# Patient Record
Sex: Male | Born: 1975 | Hispanic: Yes | Marital: Single | Smoking: Current some day smoker
Health system: Southern US, Community
[De-identification: ages and names within clinical notes are randomized; demographics above are authoritative.]

## PROBLEM LIST (undated history)

## (undated) DIAGNOSIS — J439 Emphysema, unspecified: Secondary | ICD-10-CM

## (undated) HISTORY — PX: NO PAST SURGERIES: SHX2092

---

## 2008-07-24 ENCOUNTER — Emergency Department: Payer: Self-pay | Admitting: Emergency Medicine

## 2014-05-09 ENCOUNTER — Emergency Department: Payer: Self-pay | Admitting: Emergency Medicine

## 2015-09-29 ENCOUNTER — Emergency Department
Admission: EM | Admit: 2015-09-29 | Discharge: 2015-09-29 | Disposition: A | Discharge status: Home / Self Care | Payer: PRIVATE HEALTH INSURANCE | Attending: Emergency Medicine | Admitting: Emergency Medicine

## 2015-09-29 ENCOUNTER — Encounter: Payer: Self-pay | Admitting: Emergency Medicine

## 2015-09-29 DIAGNOSIS — J029 Acute pharyngitis, unspecified: Secondary | ICD-10-CM | POA: Diagnosis not present

## 2015-09-29 DIAGNOSIS — R509 Fever, unspecified: Secondary | ICD-10-CM | POA: Diagnosis present

## 2015-09-29 LAB — POCT RAPID STREP A: Streptococcus, Group A Screen (Direct): NEGATIVE

## 2015-09-29 MED ORDER — AZITHROMYCIN 250 MG PO TABS: ORAL_TABLET | ORAL | Status: DC

## 2015-09-29 NOTE — ED Provider Notes (Signed)
[facility] Emergency Department Provider Note ____________________________________________  Time seen: 0715  I have reviewed the triage vital signs and the nursing notes.  HISTORY  Chief Complaint  Fever and Generalized Body Aches  History limited by Spanish language. Interpreter (TeleInterpreter) present during interview & exam.   HPI Paul Hayes is a 40 y.o. male presents to the ED with complaints generalized body aches and pains. He also notes onset of fever last night. He took some Advil for his symptoms and presents today for further evaluation. He also notes a generalized headache since yesterday.  History reviewed. No pertinent past medical history.  There are no active problems to display for this patient.  History reviewed. No pertinent past surgical history.  Current Outpatient Rx  Name  Route  Sig  Dispense  Refill  . azithromycin (ZITHROMAX Z-PAK) 250 MG tablet      Take 2 tablets (500 mg) on  Day 1,  followed by 1 tablet (250 mg) once daily on Days 2 through 5.   6 each   0    Allergies Review of patient's allergies indicates no known allergies.  No family history on file.  Social History Social History  Substance Use Topics  . Smoking status: Never Smoker   . Smokeless tobacco: None  . Alcohol Use: No   Review of Systems  Constitutional: Positive for fever and bodyaches Eyes: Negative for visual changes. ENT: Positive for sore throat. Cardiovascular: Negative for chest pain. Respiratory: Negative for shortness of breath. Gastrointestinal: Negative for abdominal pain, vomiting and diarrhea. Genitourinary: Negative for dysuria. Musculoskeletal: Negative for back pain. Skin: Negative for rash. Neurological: Negative for headaches, focal weakness or numbness. ____________________________________________  PHYSICAL EXAM:  VITAL SIGNS: ED Triage Vitals  Enc Vitals Group     BP 09/29/15 0418 118/76 mmHg   Pulse Rate 09/29/15 0418 100     Resp 09/29/15 0418 18     Temp 09/29/15 0418 100.5 F (38.1 C)     Temp Source 09/29/15 0418 Oral     SpO2 09/29/15 0418 97 %     Weight 09/29/15 0418 148 lb (67.132 kg)     Height 09/29/15 0418 5\' 5"  (1.651 m)     Head Cir --      Peak Flow --      Pain Score 09/29/15 0420 10     Pain Loc --      Pain Edu? --      Excl. in GC? --    Constitutional: Alert and oriented. Well appearing and in no distress. Head: Normocephalic and atraumatic.      Eyes: Conjunctivae are normal. PERRL. Normal extraocular movements      Ears: Canals clear. TMs intact bilaterally.   Nose: No congestion/rhinorrhea.   Mouth/Throat: Mucous membranes are moist. Uvula is midline. Oropharynx is erythematous.   Neck: Supple. No thyromegaly. Hematological/Lymphatic/Immunological: No cervical lymphadenopathy. Cardiovascular: Normal rate, regular rhythm.  Respiratory: Normal respiratory effort. No wheezes/rales/rhonchi. Gastrointestinal: Soft and nontender. No distention. Musculoskeletal: Nontender with normal range of motion in all extremities.  Neurologic:  Normal gait without ataxia. Normal speech and language. No gross focal neurologic deficits are appreciated. Skin:  Skin is warm, dry and intact. No rash noted. Psychiatric: Mood and affect are normal. Patient exhibits appropriate insight and judgment. ____________________________________________   LABS (pertinent positives/negatives) Labs Reviewed  CULTURE, GROUP A STREP ([facility] ONLY)  POCT RAPID STREP A   Rapid Strep - Negative ____________________________________________  INITIAL IMPRESSION / ASSESSMENT AND PLAN /  ED COURSE  Patient with clinical presentation consistent with a likely pharyngitis. We will treat empirically given his clinical presentation for strep pharyngitis. Throat culture is pending at the time of discharge. He'll be given a azithromycin prescription, and encouraged to continue to treat and  monitor other symptoms. ____________________________________________  FINAL CLINICAL IMPRESSION(S) / ED DIAGNOSES  Final diagnoses:  Pharyngitis      Lissa HoardJenise V Bacon Yudit Modesitt, PA-C 09/29/15 [id number]  Minna AntisKevin Paduchowski, MD 09/29/15 [phone]

## 2015-09-29 NOTE — ED Notes (Signed)
PA at bedside.

## 2015-09-29 NOTE — ED Notes (Addendum)
Patient ambulatory to triage with steady gait, without difficulty or distress noted; pt reports fever, body aches, & HA since yesterday; took advil last night

## 2015-09-29 NOTE — ED Notes (Addendum)
Pt c/o generalized aches and pains. Reports having a fever last night. States eyes hurt. Denies vomiting. Pt ambulatory to room. NAD. Also c/o sore throat.

## 2015-09-29 NOTE — Discharge Instructions (Signed)
Faringitis (Pharyngitis) La faringitis ocurre cuando la faringe presenta enrojecimiento, dolor e hinchazn (inflamacin).  CAUSAS  Normalmente, la faringitis se debe a una infeccin. Generalmente, estas infecciones ocurren debido a virus (viral) y se presentan cuando las personas se resfran. Sin embargo, a Advertising account executiveveces la faringitis es provocada por bacterias (bacteriana). Las alergias tambin pueden ser una causa de la faringitis. La faringitis viral se puede contagiar de Neomia Dearuna persona a otra al toser, estornudar y compartir objetos o utensilios personales (tazas, tenedores, cucharas, cepillos de diente). La faringitis bacteriana se puede contagiar de Neomia Dearuna persona a otra a travs de un contacto ms ntimo, como besar.  SIGNOS Y SNTOMAS  Los sntomas de la faringitis incluyen los siguientes:   Dolor de Advertising copywritergarganta.  Cansancio (fatiga).  Fiebre no muy elevada.  Dolor de Turkmenistancabeza.  Dolores musculares y en las articulaciones.  Erupciones cutneas  Ganglios linfticos hinchados.  Una pelcula parecida a las placas en la garganta o las amgdalas (frecuente con la faringitis bacteriana). DIAGNSTICO  El mdico le har preguntas sobre la enfermedad y sus sntomas. Normalmente, todo lo que se necesita para diagnosticar una faringitis son sus antecedentes mdicos y un examen fsico. A veces se realiza una prueba rpida para estreptococos. Tambin es posible que se realicen otros anlisis de laboratorio, segn la posible causa.  TRATAMIENTO  La faringitis viral normalmente mejorar en un plazo de 3 a 4das sin medicamentos. La faringitis bacteriana se trata con medicamentos que McGraw-Hillmatan los grmenes (antibiticos).  INSTRUCCIONES PARA EL CUIDADO EN EL HOGAR   Beba gran cantidad de lquido para mantener la orina de tono claro o color amarillo plido.  Tome solo medicamentos de venta libre o recetados, segn las indicaciones del mdico.  Si le receta antibiticos, asegrese de terminarlos, incluso si comienza  a Actorsentirse mejor.  No tome aspirina.  Descanse lo suficiente.  Hgase grgaras con 8onzas (227ml) de agua con sal (cucharadita de sal por litro de agua) cada 1 o 2horas para Science writercalmar la garganta.  Puede usar pastillas (si no corre riesgo de Health visitorahogarse) o aerosoles para Science writercalmar la garganta. SOLICITE ATENCIN MDICA SI:   Tiene bultos grandes y dolorosos en el cuello.  Tiene una erupcin cutnea.  Cuando tose elimina una expectoracin verde, amarillo amarronado o con Neolasangre. SOLICITE ATENCIN MDICA DE INMEDIATO SI:   El cuello se pone rgido.  Comienza a babear o no puede tragar lquidos.  Vomita o no puede retener los American International Groupmedicamentos ni los lquidos.  Siente un dolor intenso que no se alivia con los medicamentos recomendados.  Tiene dificultades para respirar (y no debido a la nariz tapada). ASEGRESE DE QUE:   Comprende estas instrucciones.  Controlar su afeccin.  Recibir ayuda de inmediato si no mejora o si empeora.   Esta informacin no tiene Theme park managercomo fin reemplazar el consejo del mdico. Asegrese de hacerle al mdico cualquier pregunta que tenga.   Document Released: 06/21/2005 Document Revised: 07/02/2013 Elsevier Interactive Patient Education [employer]2016 Elsevier Inc.   Take the antibiotic as directed. Take Tylenol or Motrin as needed for fevers. Rinse with warm-salty water. [facility]

## 2015-09-29 NOTE — ED Notes (Signed)
Pt able to verbalize understanding of discharge instructions and prescriptions. No questions at this time.

## 2015-10-01 LAB — CULTURE, GROUP A STREP (THRC)

## 2016-01-17 ENCOUNTER — Emergency Department
Admission: EM | Admit: 2016-01-17 | Discharge: 2016-01-17 | Disposition: A | Discharge status: Home / Self Care | Payer: PRIVATE HEALTH INSURANCE | Attending: Emergency Medicine | Admitting: Emergency Medicine

## 2016-01-17 ENCOUNTER — Emergency Department: Payer: PRIVATE HEALTH INSURANCE

## 2016-01-17 ENCOUNTER — Other Ambulatory Visit: Payer: Self-pay

## 2016-01-17 DIAGNOSIS — R51 Headache: Secondary | ICD-10-CM | POA: Insufficient documentation

## 2016-01-17 DIAGNOSIS — R519 Headache, unspecified: Secondary | ICD-10-CM

## 2016-01-17 DIAGNOSIS — Z792 Long term (current) use of antibiotics: Secondary | ICD-10-CM | POA: Diagnosis not present

## 2016-01-17 LAB — PROTIME-INR
INR: 0.96
PROTHROMBIN TIME: 13 s (ref 11.4–15.0)

## 2016-01-17 LAB — DIFFERENTIAL
BASOS ABS: 0 10*3/uL (ref 0–0.1)
Basophils Relative: 0 %
EOS PCT: 6 %
Eosinophils Absolute: 0.7 10*3/uL (ref 0–0.7)
LYMPHS ABS: 2.9 10*3/uL (ref 1.0–3.6)
LYMPHS PCT: 24 %
Monocytes Absolute: 0.8 10*3/uL (ref 0.2–1.0)
Monocytes Relative: 7 %
NEUTROS PCT: 63 %
Neutro Abs: 7.8 10*3/uL — ABNORMAL HIGH (ref 1.4–6.5)

## 2016-01-17 LAB — CBC
HCT: 46.8 % (ref 40.0–52.0)
HEMOGLOBIN: 16.1 g/dL (ref 13.0–18.0)
MCH: 30.4 pg (ref 26.0–34.0)
MCHC: 34.3 g/dL (ref 32.0–36.0)
MCV: 88.6 fL (ref 80.0–100.0)
PLATELETS: 219 10*3/uL (ref 150–440)
RBC: 5.28 MIL/uL (ref 4.40–5.90)
RDW: 13.4 % (ref 11.5–14.5)
WBC: 12.3 10*3/uL — AB (ref 3.8–10.6)

## 2016-01-17 LAB — APTT: APTT: 32 s (ref 24–36)

## 2016-01-17 LAB — URINALYSIS COMPLETE WITH MICROSCOPIC (ARMC ONLY)
BACTERIA UA: NONE SEEN
Bilirubin Urine: NEGATIVE
GLUCOSE, UA: NEGATIVE mg/dL
Ketones, ur: NEGATIVE mg/dL
LEUKOCYTES UA: NEGATIVE
NITRITE: NEGATIVE
PH: 6 (ref 5.0–8.0)
PROTEIN: NEGATIVE mg/dL
SPECIFIC GRAVITY, URINE: 1.01 (ref 1.005–1.030)
SQUAMOUS EPITHELIAL / LPF: NONE SEEN

## 2016-01-17 LAB — COMPREHENSIVE METABOLIC PANEL
ALT: 35 U/L (ref 17–63)
ANION GAP: 9 (ref 5–15)
AST: 23 U/L (ref 15–41)
Albumin: 4.4 g/dL (ref 3.5–5.0)
Alkaline Phosphatase: 74 U/L (ref 38–126)
BUN: 14 mg/dL (ref 6–20)
CHLORIDE: 104 mmol/L (ref 101–111)
CO2: 24 mmol/L (ref 22–32)
CREATININE: 1.01 mg/dL (ref 0.61–1.24)
Calcium: 9.2 mg/dL (ref 8.9–10.3)
Glucose, Bld: 82 mg/dL (ref 65–99)
POTASSIUM: 3.5 mmol/L (ref 3.5–5.1)
SODIUM: 137 mmol/L (ref 135–145)
TOTAL PROTEIN: 7.6 g/dL (ref 6.5–8.1)
Total Bilirubin: 0.3 mg/dL (ref 0.3–1.2)

## 2016-01-17 LAB — TROPONIN I: Troponin I: <0.03 ng/mL (ref <0.031)

## 2016-01-17 MED ORDER — KETOROLAC TROMETHAMINE 30 MG/ML IJ SOLN
INTRAMUSCULAR | Status: AC
  Filled 2016-01-17: qty 1

## 2016-01-17 MED ORDER — DIPHENHYDRAMINE HCL 25 MG PO CAPS
25.0000 mg | ORAL_CAPSULE | Freq: Once | ORAL | Status: AC
  Administered 2016-01-17: 25 mg via ORAL
  Filled 2016-01-17: qty 1

## 2016-01-17 MED ORDER — KETOROLAC TROMETHAMINE 30 MG/ML IJ SOLN
30.0000 mg | Freq: Once | INTRAMUSCULAR | Status: AC
  Administered 2016-01-17: 30 mg via INTRAVENOUS
  Filled 2016-01-17: qty 1

## 2016-01-17 MED ORDER — DIPHENHYDRAMINE HCL 50 MG/ML IJ SOLN
INTRAMUSCULAR | Status: AC
  Filled 2016-01-17: qty 1

## 2016-01-17 MED ORDER — ONDANSETRON HCL 4 MG/2ML IJ SOLN
INTRAMUSCULAR | Status: AC
  Filled 2016-01-17: qty 2

## 2016-01-17 MED ORDER — GADOBENATE DIMEGLUMINE 529 MG/ML IV SOLN
15.0000 mL | Freq: Once | INTRAVENOUS | Status: AC | PRN
  Administered 2016-01-17: 13 mL via INTRAVENOUS

## 2016-01-17 MED ORDER — ONDANSETRON HCL 4 MG/2ML IJ SOLN
4.0000 mg | Freq: Once | INTRAMUSCULAR | Status: AC
  Administered 2016-01-17: 4 mg via INTRAVENOUS

## 2016-01-17 MED ORDER — SODIUM CHLORIDE 0.9 % IV BOLUS (SEPSIS)
1000.0000 mL | Freq: Once | INTRAVENOUS | Status: AC
  Administered 2016-01-17: 1000 mL via INTRAVENOUS

## 2016-01-17 NOTE — ED Notes (Signed)
Says he has  Headache top of head and facial for couple weeks.  Was uinable to sleep last night due to pain.  Has bottle of ibuprofen with him.  Also left chest pain 2 days ago and feels sob.  Slight pain in chest now.  Says the ibuprofen is working for short time, but headache comes back.

## 2016-01-17 NOTE — ED Provider Notes (Signed)
[facility] Emergency Department Provider Note   ____________________________________________  Time seen: I have reviewed the triage vital signs and the triage nursing note.  HISTORY  Chief Complaint Headache   Historian Patient, through Spanish interprete  HPI Paul Hayes is a 40 y.o. male who is here because of a gradual onset headache about 2 weeks ago, not doing anything in particular at the time of onset, it has been constant although waxing and waning, with use of ibuprofen seems to help it decreased somewhat and then it comes right back. He woke up yesterday morning feeling like his left arm was numb and tingling, describes similar to "sleeping on it wrong. "He doesn't report any trauma or injury to the arm or neck pain.  He states he occasionally has facial pressure. He states that he had a headache in January, but at that time he had a fever, and that he did not have a head CT. He was seen both at [facility] as well as [facility] and told to take ibuprofen.  States that he's had no chest pain, but when the pain gets really bad sometimes he feels short of breath.      No past medical history on file.  There are no active problems to display for this patient.   No past surgical history on file.  Current Outpatient Rx  Name  Route  Sig  Dispense  Refill  . azithromycin (ZITHROMAX Z-PAK) 250 MG tablet      Take 2 tablets (500 mg) on  Day 1,  followed by 1 tablet (250 mg) once daily on Days 2 through 5.   6 each   0     Allergies Review of patient's allergies indicates no known allergies.  No family history on file.  Social History Social History  Substance Use Topics  . Smoking status: Never Smoker   . Smokeless tobacco: Not on file  . Alcohol Use: No    Review of Systems  Constitutional: Negative for fever. Eyes: Reports that occasionally he will feel like he has blurry vision, but no problems now. No photophobia. ENT:  Negative for sore throat. Cardiovascular: Negative for chest pain. Respiratory: Negative for any current shortness of breath. Gastrointestinal: Negative for abdominal pain, vomiting and diarrhea. Genitourinary: Negative for dysuria. Musculoskeletal: Negative for back pain. Skin: Negative for rash. Neurological: Global and frontal headache. 10 point Review of Systems otherwise negative ____________________________________________   PHYSICAL EXAM:  VITAL SIGNS: ED Triage Vitals  Enc Vitals Group     BP 01/17/16 1137 122/88 mmHg     Pulse Rate 01/17/16 1137 84     Resp 01/17/16 1137 14     Temp --      Temp src --      SpO2 01/17/16 1137 99 %     Weight --      Height --      Head Cir --      Peak Flow --      Pain Score 01/17/16 1137 7     Pain Loc --      Pain Edu? --      Excl. in GC? --      Constitutional: Alert and oriented. Well appearing and in no distress. HEENT   Head: Normocephalic and atraumatic.      Eyes: Conjunctivae are normal. PERRL. Normal extraocular movements.      Ears:         Nose: No congestion/rhinnorhea.   Mouth/Throat: Mucous membranes  are moist.   Neck: No stridor. Cardiovascular/Chest: Normal rate, regular rhythm.  No murmurs, rubs, or gallops. Respiratory: Normal respiratory effort without tachypnea nor retractions. Breath sounds are clear and equal bilaterally. No wheezes/rales/rhonchi. Gastrointestinal: Soft. No distention, no guarding, no rebound. Nontender.    Genitourinary/rectal:Deferred Musculoskeletal: Nontender with normal range of motion in all extremities. No joint effusions.  No lower extremity tenderness.  No edema. Neurologic:  Normal speech and language. No facial droop. Finger to nose intact bilaterally. 5 out of 5 strength in 4 extremities. Left arm and left leg paresthesia. Normal facial sensation.  Skin:  Skin is warm, dry and intact. No rash noted. Psychiatric: Mood and affect are normal. Speech and behavior are  normal. Patient exhibits appropriate insight and judgment.  ____________________________________________   EKG I, Governor Rooksebecca Harjot Zavadil, MD, the attending physician have personally viewed and interpreted all ECGs.  85 bpm. Normal sinus rhythm. Narrow QRS. Normal axis. Nonspecific T wave. Q waves septally. ____________________________________________  LABS (pertinent positives/negatives)  Urinalysis negative INR 0.96 White blood count 12.3, hemoglobin 16.1 and platelet count 219 Conference metabolic panel without significant abnormality Troponin less than 0.03 ____________________________________________  RADIOLOGY All Xrays were viewed by me. Imaging interpreted by Radiologist.  CT head without contrast:  Normal head CT  MR a neck with and without contrast: Negative  MRA head: Negative MRI brain without contrast: Negative unenhanced MRI the brain. Chronic sinusitis. __________________________________________  PROCEDURES  Procedure(s) performed: None  Critical Care performed: None  ____________________________________________   ED COURSE / ASSESSMENT AND PLAN  Pertinent labs & imaging results that were available during my care of the patient were reviewed by me and considered in my medical decision making (see chart for details).   Patient here with several weeks of headache, and since yesterday morning paresthesia of the left arm and left leg which is persistent. He reports no prior head CT, and given the neurologic complaint of paresthesia and ongoing headache, I will obtain head CT imaging.  Skin unremarkable for acute emergency cause. I will obtain an MRI and MRA of the head and neck.  His additional MRI and MRA imaging are reassuring and negative for emergency medical condition.  I discussed this case with on-call neurologist, Dr. Thad Rangereynolds, who is in agreement, patient may be discharged home for outpatient follow-up.  I discussed with the patient that his exam and  evaluation are reassuring. I am referring him for primary care follow-up as well as neurology follow-up.    CONSULTATIONS:   Phone consultation with neurologist, Dr. Thad Rangereynolds.   Patient / Family / Caregiver informed of clinical course, medical decision-making process, and agree with plan.   I discussed return precautions, follow-up instructions, and discharged instructions with patient and/or family.   ___________________________________________   FINAL CLINICAL IMPRESSION(S) / ED DIAGNOSES   Final diagnoses:  Headache              Note: This dictation was prepared with Dragon dictation. Any transcriptional errors that result from this process are unintentional   Governor Rooksebecca Bryonna Sundby, MD 01/17/16 1545

## 2016-01-17 NOTE — Discharge Instructions (Signed)
Your examine and evaluation are reassuring today. Continue to take over-the-counter ibuprofen and/or acetaminophen for headache.  Return to the emergency department for any worsening condition including worsening headache, weakness or numbness, chest pain, passing out, or any other symptoms concerning to you.   Su examen y evaluacion fueron buenos hoy.  Siga tomando medicamento que se compra sin receta como ibuprofen y o tylenol para el dolor de Turkmenistancabeza.  Vuelvase a la sala de emergencias si se empeora o si el dolor de cabeza se le empeora, si tiene debilidad u hormigueo, dolor al pecho, se desmaya u otros simptomas preocupantes.  Dolor de cabeza general sin causa (General Headache Without Cause) El dolor de cabeza es un dolor o malestar que se siente en la zona de la cabeza o del cuello. Hay muchas causas y tipos de dolores de Turkmenistancabeza. En algunos casos, es posible que no se encuentre la causa.  CUIDADOS EN EL HOGAR  Control del W. R. Berkleydolor  Tome los medicamentos de venta libre y los recetados solamente como se lo haya indicado el mdico.  Cuando sienta dolor de cabeza acustese en un cuarto oscuro y tranquilo.  Si se lo indican, aplique hielo sobre la cabeza y la zona del cuello:  Ponga el hielo en una bolsa plstica.  Coloque una toalla entre la piel y la bolsa de hielo.  Coloque el hielo durante 20minutos, 2 a 3veces por Futures traderda.  Utilice una almohadilla trmica o tome una ducha con agua caliente para aplicar calor en la cabeza y la zona del cuello como se lo haya indicado el mdico.  Mantenga las luces tenues si le Liz Claibornemolesta las luces brillantes o sus dolores de cabeza empeoran. Comida y bebida  Mantenga un horario para las comidas.  Beba menos alcohol.  Consuma menos o deje de tomar cafena. Instrucciones generales  Concurra a todas las visitas de control como se lo haya indicado el mdico. Esto es importante.  Lleve un registro diario para Financial risk analystaveriguar si ciertas cosas provocan los  dolores de Turkmenistancabeza. Por ejemplo, escriba los siguientes datos:  Lo que usted come y Estate agentbebe.  Cunto tiempo duerme.  Algn cambio en su dieta o en los medicamentos.  Realice actividades relajantes, como recibir Ketchummasajes.  Disminuya el nivel de estrs.  Sintese con la espalda recta. No contraiga (tensione) los msculos.  No consuma productos que contengan tabaco. Estos incluyen cigarrillos, tabaco para mascar y Administrator, Civil Servicecigarrillos electrnicos. Si necesita ayuda para dejar de fumar, consulte al mdico.  Haga ejercicios con regularidad tal como se lo indic el mdico.  Duerma lo suficiente. Esto a menudo significa entre 7 y 9horas de sueo. SOLICITE AYUDA SI:  Los medicamentos no logran Asbury Automotive Groupaliviar los sntomas.  Tiene un dolor de cabeza que es diferente a los otros dolores de Turkmenistancabeza.  Tiene malestar estomacal (nuseas) o vomita.  Tiene fiebre. SOLICITE AYUDA DE INMEDIATO SI:   El dolor de Maltacabeza empeora.  Sigue vomitando.  Presenta rigidez en el cuello.  Tiene dificultad para ver.  Tiene dificultad para hablar.  Siente dolor en el ojo o en el odo.  Sus msculos estn dbiles, o pierde el control muscular.  Pierde el equilibrio o tiene problemas para Advertising account plannercaminar.  Siente que se desvanece (pierde el conocimiento) o se desmaya.  Se siente confundido.   Esta informacin no tiene Theme park managercomo fin reemplazar el consejo del mdico. Asegrese de hacerle al mdico cualquier pregunta que tenga.   Document Released: 12/04/2011 Document Revised: 06/02/2015 Elsevier Interactive Patient Education [employer]2016 Elsevier Inc.

## 2016-01-17 NOTE — ED Notes (Signed)
Thru interpreter: pt presents with headache x 2 weeks. Has been treating with OTC ibuprofen with no improvement.

## 2016-01-17 NOTE — ED Notes (Signed)
Patient transported to MRI 

## 2016-03-11 ENCOUNTER — Emergency Department: Payer: PRIVATE HEALTH INSURANCE

## 2016-03-11 ENCOUNTER — Encounter: Payer: Self-pay | Admitting: Emergency Medicine

## 2016-03-11 ENCOUNTER — Emergency Department
Admission: EM | Admit: 2016-03-11 | Discharge: 2016-03-11 | Disposition: A | Discharge status: Home / Self Care | Payer: PRIVATE HEALTH INSURANCE | Attending: Emergency Medicine | Admitting: Emergency Medicine

## 2016-03-11 ENCOUNTER — Other Ambulatory Visit: Payer: Self-pay

## 2016-03-11 DIAGNOSIS — R0602 Shortness of breath: Secondary | ICD-10-CM | POA: Insufficient documentation

## 2016-03-11 DIAGNOSIS — F17210 Nicotine dependence, cigarettes, uncomplicated: Secondary | ICD-10-CM | POA: Diagnosis not present

## 2016-03-11 DIAGNOSIS — R0600 Dyspnea, unspecified: Secondary | ICD-10-CM

## 2016-03-11 LAB — COMPREHENSIVE METABOLIC PANEL
ALBUMIN: 4.2 g/dL (ref 3.5–5.0)
ALK PHOS: 65 U/L (ref 38–126)
ALT: 37 U/L (ref 17–63)
ANION GAP: 6 (ref 5–15)
AST: 28 U/L (ref 15–41)
BILIRUBIN TOTAL: 0.4 mg/dL (ref 0.3–1.2)
BUN: 18 mg/dL (ref 6–20)
CO2: 23 mmol/L (ref 22–32)
Calcium: 8.8 mg/dL — ABNORMAL LOW (ref 8.9–10.3)
Chloride: 109 mmol/L (ref 101–111)
Creatinine, Ser: 1.45 mg/dL — ABNORMAL HIGH (ref 0.61–1.24)
GFR, EST NON AFRICAN AMERICAN: 59 mL/min — AB (ref >60)
Glucose, Bld: 121 mg/dL — ABNORMAL HIGH (ref 65–99)
POTASSIUM: 3.5 mmol/L (ref 3.5–5.1)
Sodium: 138 mmol/L (ref 135–145)
TOTAL PROTEIN: 6.9 g/dL (ref 6.5–8.1)

## 2016-03-11 LAB — CBC
HEMATOCRIT: 44.1 % (ref 40.0–52.0)
HEMOGLOBIN: 15.1 g/dL (ref 13.0–18.0)
MCH: 30.7 pg (ref 26.0–34.0)
MCHC: 34.2 g/dL (ref 32.0–36.0)
MCV: 89.5 fL (ref 80.0–100.0)
Platelets: 208 10*3/uL (ref 150–440)
RBC: 4.92 MIL/uL (ref 4.40–5.90)
RDW: 13.5 % (ref 11.5–14.5)
WBC: 9.1 10*3/uL (ref 3.8–10.6)

## 2016-03-11 LAB — TROPONIN I

## 2016-03-11 MED ORDER — PREDNISONE 20 MG PO TABS: 40.0000 mg | ORAL_TABLET | Freq: Every day | ORAL | Status: DC

## 2016-03-11 MED ORDER — ALBUTEROL SULFATE HFA 108 (90 BASE) MCG/ACT IN AERS: 2.0000 | INHALATION_SPRAY | Freq: Four times a day (QID) | RESPIRATORY_TRACT | Status: DC | PRN

## 2016-03-11 MED ORDER — IPRATROPIUM-ALBUTEROL 0.5-2.5 (3) MG/3ML IN SOLN
3.0000 mL | Freq: Once | RESPIRATORY_TRACT | Status: AC
  Administered 2016-03-11: 3 mL via RESPIRATORY_TRACT
  Filled 2016-03-11: qty 3

## 2016-03-11 NOTE — Discharge Instructions (Signed)
Falta de aire  (Shortness of Breath)  Falta de aire significa que tiene dificultad para respirar. Es necesario que reciba atencin mdica de inmediato.  CUIDADOS EN EL HOGAR    No fume.   Evite estar cerca de sustancias qumicas (vapores de pintura, polvo) que puedan dificultar su respiracin.   Descanse todo lo que sea necesario. Retome lentamente sus actividades normales.   Tome solo los medicamentos segn le haya indicado el mdico.   Cumpla con las visitas al mdico segn las indicaciones.  SOLICITE AYUDA DE INMEDIATO SI:    La falta de aire empeora.   Tiene mareos, pierde el conocimiento (se desmaya) o tiene tos que no mejora con medicamentos.   Tose y escupe sangre.   Siente dolor al respirar.   Tiene dolor en el pecho, los brazos, los hombros o el vientre (abdomen).   Tiene fiebre.   No puede subir escaleras o realizar ejercicio del modo en que lo haca habitualmente.   No mejora como se esperaba.   Le cuesta hacer las actividades normales, aun si ha descansado lo suficiente.   Tiene problemas con los medicamentos.   Aparece algn sntoma nuevo.  ASEGRESE DE QUE:   Comprende estas instrucciones.   Controlar su afeccin.   Recibir ayuda de inmediato si no mejora o si empeora.     Esta informacin no tiene como fin reemplazar el consejo del mdico. Asegrese de hacerle al mdico cualquier pregunta que tenga.     Document Released: 03/01/2010 Document Revised: 09/16/2013  Elsevier Interactive Patient Education 2016 Elsevier Inc.

## 2016-03-11 NOTE — ED Notes (Signed)
States does not feel SOB but does feel like it is hard to get a deep breath x 2 days. Denies fevers. Does have chest pain when takes a deep breath.

## 2016-03-11 NOTE — ED Provider Notes (Signed)
[facility] Emergency Department Provider Note  Time seen: 6:32 PM  I have reviewed the triage vital signs and the nursing notes.   HISTORY  Chief Complaint Shortness of Breath Hospital interpreter/ipad used for this evaluate.   HPI Paul Hayes is a 40 y.o. male with no past medical history who presents the emergency department shortness of breath. According to the patient for the past 2 days he feels like it is difficult to get a full and deep breath. Denies any cough, congestion, fever. Does state mild chest tightness when he tries to take a deep breath. States this is been happening on and off for several years, especially during very hot weather when he is exerting himself. He has never been diagnosed with asthma. States he took a friend's albuterol inhaler and felt better after taking it but states it wore off.     History reviewed. No pertinent past medical history.  There are no active problems to display for this patient.   History reviewed. No pertinent past surgical history.  Current Outpatient Rx  Name  Route  Sig  Dispense  Refill  . azithromycin (ZITHROMAX Z-PAK) 250 MG tablet      Take 2 tablets (500 mg) on  Day 1,  followed by 1 tablet (250 mg) once daily on Days 2 through 5.   6 each   0     Allergies Review of patient's allergies indicates no known allergies.  No family history on file.  Social History Social History  Substance Use Topics  . Smoking status: Current Every Day Smoker -- 0.10 packs/day    Types: Cigarettes  . Smokeless tobacco: None  . Alcohol Use: No    Review of Systems Constitutional: Negative for fever. Cardiovascular: Negative for chest pain. Respiratory: Positive for shortness of breath. Negative cough. Gastrointestinal: Negative for abdominal pain, vomiting and diarrhea. Musculoskeletal: Negative for back pain Neurological: Negative for headaches, focal weakness or numbness. 10-point ROS  otherwise negative.  ____________________________________________   PHYSICAL EXAM:  VITAL SIGNS: ED Triage Vitals  Enc Vitals Group     BP 03/11/16 1555 107/71 mmHg     Pulse Rate 03/11/16 1555 76     Resp 03/11/16 1555 18     Temp 03/11/16 1555 98.5 F (36.9 C)     Temp Source 03/11/16 1555 Oral     SpO2 03/11/16 1555 96 %     Weight 03/11/16 1555 150 lb (68.04 kg)     Height 03/11/16 1555 5\' 5"  (1.651 m)     Head Cir --      Peak Flow --      Pain Score 03/11/16 1603 5     Pain Loc --      Pain Edu? --      Excl. in GC? --     Constitutional: Alert and oriented. Well appearing and in no distress. Eyes: Normal exam ENT   Head: Normocephalic and atraumatic.   Mouth/Throat: Mucous membranes are moist. Cardiovascular: Normal rate, regular rhythm. No murmur Respiratory: Normal respiratory effort without tachypnea nor retractions. Breath sounds are clear and equal bilaterally. No wheezes/rales/rhonchi. Gastrointestinal: Soft and nontender. No distention.   Musculoskeletal: Nontender with normal range of motion in all extremities. Neurologic:  Normal speech and language. No gross focal neurologic deficits  Skin:  Skin is warm, dry and intact.  Psychiatric: Mood and affect are normal.   ____________________________________________    EKG  EKG reviewed and interpreted by myself shows normal sinus  rhythm at 75 bpm, narrow QRS, normal axis, normal intervals, no ST changes. Overall normal EKG.  ____________________________________________    RADIOLOGY  Chest x-ray is clear  ____________________________________________    INITIAL IMPRESSION / ASSESSMENT AND PLAN / ED COURSE  Pertinent labs & imaging results that were available during my care of the patient were reviewed by me and considered in my medical decision making (see chart for details).  Patient presents for dyspnea with a feeling that he cannot get a full and deep breath. States this is  intermittently been happening for years. Worse when he is outside working in hot weather. His symptoms are very suggestive of asthmatic disease. We will discharge with a course of prednisone and albuterol. We'll dose a DuoNeb in the emergency department. Overall the patient appears very well, normal vitals, well-appearing labs including a negative troponin.  ____________________________________________   FINAL CLINICAL IMPRESSION(S) / ED DIAGNOSES  Dyspnea  Minna Antis, MD 03/11/16 1836

## 2016-11-16 ENCOUNTER — Emergency Department: Payer: Self-pay

## 2016-11-16 ENCOUNTER — Encounter: Payer: Self-pay | Admitting: Emergency Medicine

## 2016-11-16 ENCOUNTER — Emergency Department
Admission: EM | Admit: 2016-11-16 | Discharge: 2016-11-16 | Disposition: A | Discharge status: Home / Self Care | Payer: Self-pay | Attending: Emergency Medicine | Admitting: Emergency Medicine

## 2016-11-16 DIAGNOSIS — R0602 Shortness of breath: Secondary | ICD-10-CM | POA: Insufficient documentation

## 2016-11-16 DIAGNOSIS — F17210 Nicotine dependence, cigarettes, uncomplicated: Secondary | ICD-10-CM | POA: Insufficient documentation

## 2016-11-16 DIAGNOSIS — Z5321 Procedure and treatment not carried out due to patient leaving prior to being seen by health care provider: Secondary | ICD-10-CM | POA: Insufficient documentation

## 2016-11-16 NOTE — ED Triage Notes (Addendum)
Pt ambulatory to triage in NAD, reports nasal congestion and SOB.  Reports feel like he's not getting air into left lung.  When asked hx of copd or asthma, reports he was referred to a specialist but never went, unsure of dx.    Triage completed with interpreter, rafael.

## 2016-11-16 NOTE — ED Notes (Signed)
Pt stating he does not want to be seen, only wants information for specialist.  Information given for [facility].

## 2018-02-05 ENCOUNTER — Emergency Department: Payer: BLUE CROSS/BLUE SHIELD

## 2018-02-05 ENCOUNTER — Emergency Department
Admission: EM | Admit: 2018-02-05 | Discharge: 2018-02-05 | Disposition: A | Discharge status: Home / Self Care | Payer: BLUE CROSS/BLUE SHIELD | Attending: Emergency Medicine | Admitting: Emergency Medicine

## 2018-02-05 ENCOUNTER — Encounter: Payer: Self-pay | Admitting: Emergency Medicine

## 2018-02-05 DIAGNOSIS — R202 Paresthesia of skin: Secondary | ICD-10-CM | POA: Diagnosis not present

## 2018-02-05 DIAGNOSIS — R0789 Other chest pain: Secondary | ICD-10-CM

## 2018-02-05 DIAGNOSIS — F17210 Nicotine dependence, cigarettes, uncomplicated: Secondary | ICD-10-CM | POA: Diagnosis not present

## 2018-02-05 DIAGNOSIS — R079 Chest pain, unspecified: Secondary | ICD-10-CM | POA: Insufficient documentation

## 2018-02-05 LAB — BASIC METABOLIC PANEL
ANION GAP: 6 (ref 5–15)
BUN: 16 mg/dL (ref 6–20)
CALCIUM: 9.1 mg/dL (ref 8.9–10.3)
CO2: 26 mmol/L (ref 22–32)
Chloride: 101 mmol/L (ref 101–111)
Creatinine, Ser: 0.79 mg/dL (ref 0.61–1.24)
GFR calc Af Amer: >60 mL/min (ref >60)
Glucose, Bld: 128 mg/dL — ABNORMAL HIGH (ref 65–99)
POTASSIUM: 3.7 mmol/L (ref 3.5–5.1)
Sodium: 133 mmol/L — ABNORMAL LOW (ref 135–145)

## 2018-02-05 LAB — CBC
HEMATOCRIT: 46.4 % (ref 40.0–52.0)
HEMOGLOBIN: 15.9 g/dL (ref 13.0–18.0)
MCH: 30.6 pg (ref 26.0–34.0)
MCHC: 34.3 g/dL (ref 32.0–36.0)
MCV: 89.1 fL (ref 80.0–100.0)
Platelets: 210 10*3/uL (ref 150–440)
RBC: 5.21 MIL/uL (ref 4.40–5.90)
RDW: 13.3 % (ref 11.5–14.5)
WBC: 8.6 10*3/uL (ref 3.8–10.6)

## 2018-02-05 LAB — TROPONIN I: Troponin I: <0.03 ng/mL (ref <0.03)

## 2018-02-05 MED ORDER — DIAZEPAM 5 MG PO TABS: 5.0000 mg | ORAL_TABLET | Freq: Three times a day (TID) | ORAL | 0 refills | Status: DC | PRN

## 2018-02-05 MED ORDER — PREDNISONE 10 MG (21) PO TBPK: ORAL_TABLET | Freq: Every day | ORAL | 0 refills | Status: DC

## 2018-02-05 MED ORDER — PREDNISONE 20 MG PO TABS
60.0000 mg | ORAL_TABLET | Freq: Once | ORAL | Status: AC
  Administered 2018-02-05: 60 mg via ORAL
  Filled 2018-02-05: qty 3

## 2018-02-05 MED ORDER — DIAZEPAM 5 MG PO TABS
5.0000 mg | ORAL_TABLET | Freq: Once | ORAL | Status: AC
  Administered 2018-02-05: 5 mg via ORAL
  Filled 2018-02-05: qty 1

## 2018-02-05 NOTE — ED Notes (Signed)
First Nurse Note:  Patient to TR3, EKG performed and shown to MD.  Interpreter requested. Alert, skin warm and dry.  NAD.

## 2018-02-05 NOTE — ED Provider Notes (Signed)
[facility] Emergency Department Provider Note       Time seen: ----------------------------------------- 11:49 AM on 02/05/2018 -----------------------------------------   I have reviewed the triage vital signs and the nursing notes.  HISTORY   Chief Complaint Chest Pain and Numbness    HPI Paul Hayes is a 42 y.o. male with no significant past medical history who presents to the ED for intermittent left-sided chest pain for the past month.  Patient states pain radiates into his left arm.  He reports some intermittent tongue numbness as well.  He denies fevers, chills, shortness of breath, vomiting or diarrhea.  He has had some paresthesias in his left arm that particular involves his left pinky finger.  History reviewed. No pertinent past medical history.  There are no active problems to display for this patient.   History reviewed. No pertinent surgical history.  Allergies Patient has no known allergies.  Social History Social History   Tobacco Use  . Smoking status: Current Every Day Smoker    Packs/day: 0.10    Types: Cigarettes  . Smokeless tobacco: Never Used  Substance Use Topics  . Alcohol use: No  . Drug use: Not on file   Review of Systems Constitutional: Negative for fever. Cardiovascular: Positive for chest pain Respiratory: Negative for shortness of breath. Gastrointestinal: Negative for abdominal pain, vomiting and diarrhea. Musculoskeletal: Negative for back pain. Skin: Negative for rash. Neurological: Positive for headache, positive for left arm paresthesias  All systems negative/normal/unremarkable except as stated in the HPI  ____________________________________________   PHYSICAL EXAM:  VITAL SIGNS: ED Triage Vitals  Enc Vitals Group     BP 02/05/18 1014 131/81     Pulse Rate 02/05/18 1014 79     Resp 02/05/18 1014 18     Temp 02/05/18 1014 98.7 F (37.1 C)     Temp Source 02/05/18 1014 Oral    SpO2 02/05/18 1014 98 %     Weight 02/05/18 1018 150 lb (68 kg)     Height 02/05/18 1018  (1.676 m)     Head Circumference --      Peak Flow --      Pain Score 02/05/18 1017 0     Pain Loc --      Pain Edu? --      Excl. in GC? --    Constitutional: Alert and oriented. Well appearing and in no distress. Eyes: Conjunctivae are normal. Normal extraocular movements. ENT   Head: Normocephalic and atraumatic.   Nose: No congestion/rhinnorhea.   Mouth/Throat: Mucous membranes are moist.   Neck: No stridor. Cardiovascular: Normal rate, regular rhythm. No murmurs, rubs, or gallops. Respiratory: Normal respiratory effort without tachypnea nor retractions. Breath sounds are clear and equal bilaterally. No wheezes/rales/rhonchi. Gastrointestinal: Soft and nontender. Normal bowel sounds Musculoskeletal: Nontender with normal range of motion in extremities. No lower extremity tenderness nor edema. Neurologic:  Normal speech and language. No gross focal neurologic deficits are appreciated.  Skin:  Skin is warm, dry and intact. No rash noted. Psychiatric: Mood and affect are normal. Speech and behavior are normal.  ____________________________________________  EKG: Interpreted by me.  Sinus rhythm rate 87 bpm, normal PR interval, normal QRS size, nonspecific T wave changes.  EKG is unchanged from prior  ____________________________________________  ED COURSE:  As part of my medical decision making, I reviewed the following data within the electronic MEDICAL RECORD NUMBER History obtained from family if available, nursing notes, old chart and ekg, as well as notes  from prior ED visits. Patient presented for chest pain, we will assess with labs and imaging as indicated at this time.   Procedures ____________________________________________   LABS (pertinent positives/negatives)  Labs Reviewed  BASIC METABOLIC PANEL - Abnormal; Notable for the following components:      Result  Value   Sodium 133 (*)    Glucose, Bld 128 (*)    All other components within normal limits  CBC  TROPONIN I  TROPONIN I    RADIOLOGY Chest x-ray is unremarkable  ____________________________________________  DIFFERENTIAL DIAGNOSIS   Musculoskeletal pain, cervical radiculopathy, peripheral neuropathy, unstable angina unlikely  FINAL ASSESSMENT AND PLAN  Chest pain, paresthesias   Plan: The patient had presented for chest pain and paresthesias which may be coming from some cervical radiculopathy. Patient's labs were negative including repeat troponin. Patient's imaging is unremarkable.  He will be discharged with prednisone and Valium and will be referred back to [facility] for follow-up.   Ulice Dash, MD   Note: This note was generated in part or whole with voice recognition software. Voice recognition is usually quite accurate but there are transcription errors that can and very often do occur. I apologize for any typographical errors that were not detected and corrected.     Emily Filbert, MD 02/05/18 1450

## 2018-02-05 NOTE — ED Triage Notes (Signed)
Per interpreter, for the past month pt has had intermittent pain in his left chest that radiates into his left arm. Pt also reports some intermittent tongue numbness.

## 2018-02-26 IMAGING — MR MR MRA HEAD W/O CM
11 series · 38 of 48 positions shown · non-contrast
Comparison: None.

CLINICAL DATA: Headache

EXAM:
MRA HEAD WITHOUT CONTRAST
TECHNIQUE: Angiographic images of the Circle of Willis were obtained using MRA
technique without intravenous contrast.

[Series 2: T1 · sagittal · 5.0mm · 0.45mm/px · 2 of 29 slices shown (1 of 2)]
[im 1/29]
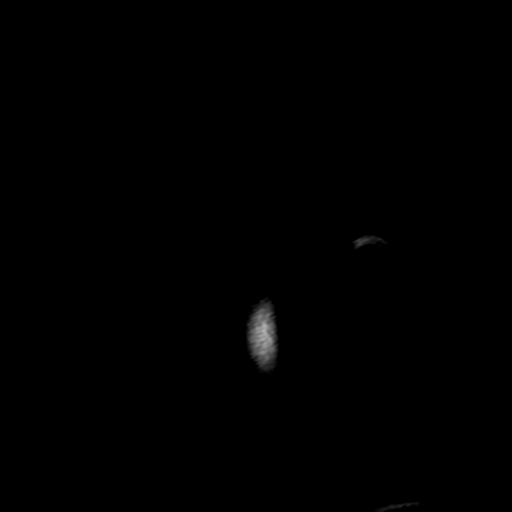
[im 29/29]
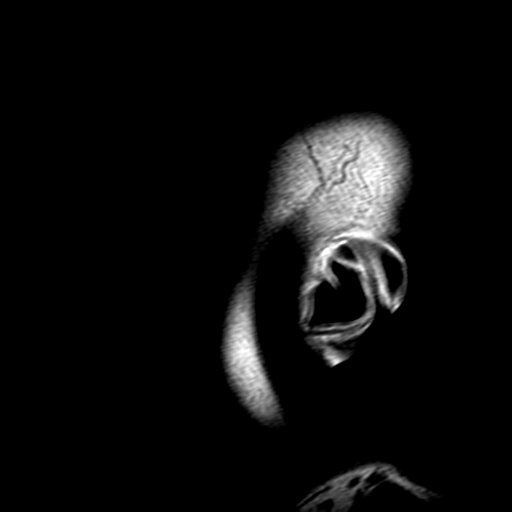

[Series 4: DWI · axial · 4.0mm · 0.94mm/px · z∈[-88,+80]mm · 4 of 43 slices shown (1 of 4)]
[im 1/43]
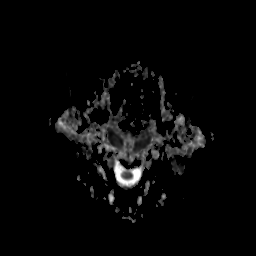
[im 15/43]
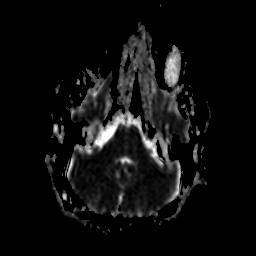
[im 29/43]
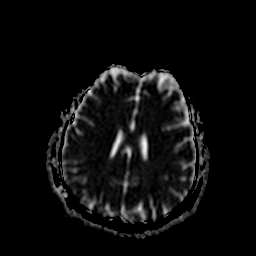
[im 43/43]
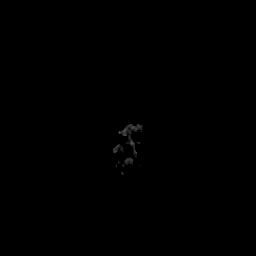

[Series 5: DWI · axial · 4.0mm · 0.94mm/px · z∈[-88,+80]mm · 4 of 43 slices shown (2 of 4)]
[im 1/43]
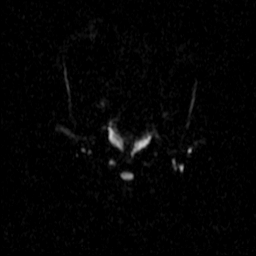
[im 15/43]
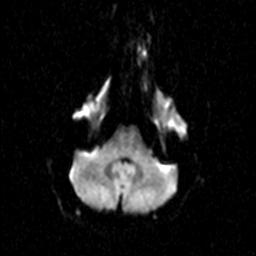
[im 29/43]
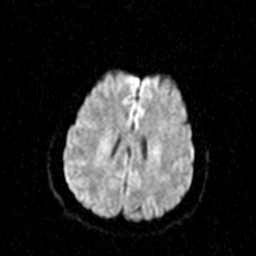
[im 43/43]
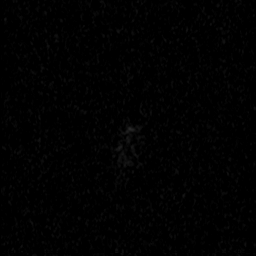

[Series 7: DWI · coronal · 5.0mm · 1.80mm/px · 4 of 39 slices shown (3 of 4)]
[im 1/39]
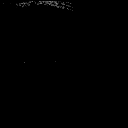
[im 13/39]
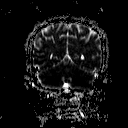
[im 26/39]
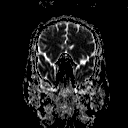
[im 39/39]
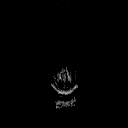

[Series 8: DWI · coronal · 5.0mm · 1.80mm/px · 4 of 38 slices shown (4 of 4)]
[im 1/38]
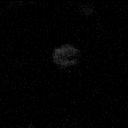
[im 13/38]
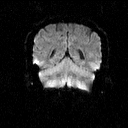
[im 25/38]
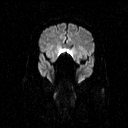
[im 38/38]
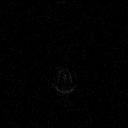

[Series 9: T2 · axial · 5.0mm · 0.45mm/px · z∈[-85,+84]mm · 3 of 27 slices shown (1 of 3)]
[im 1/27]
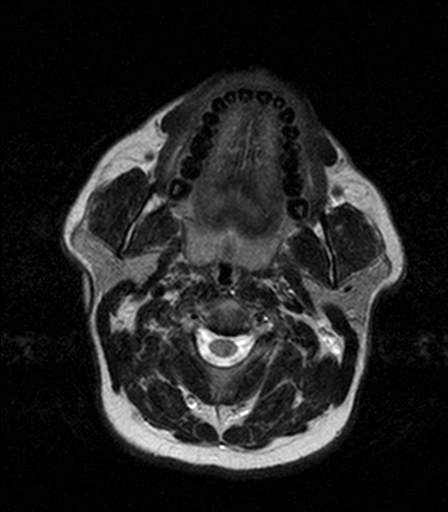
[im 14/27]
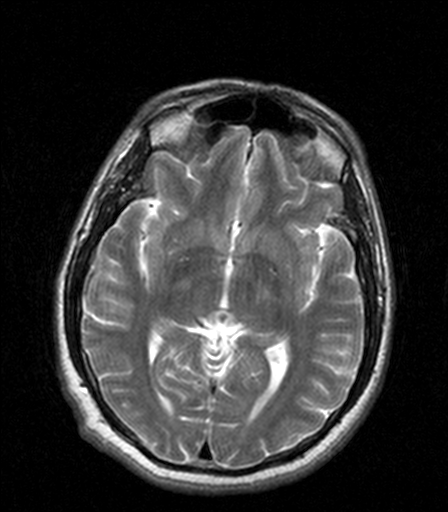
[im 27/27]
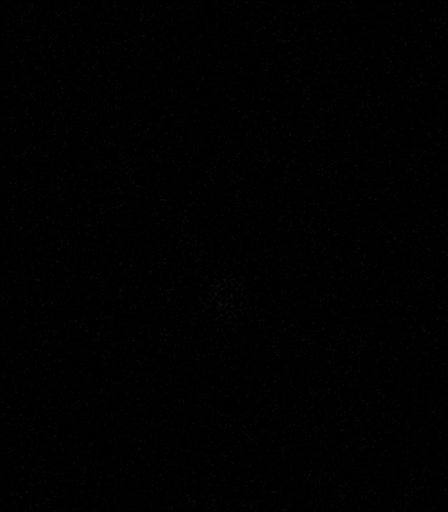

[Series 10: T2 · coronal · 5.0mm · 0.45mm/px · 3 of 29 slices shown (2 of 3)]
[im 1/29]
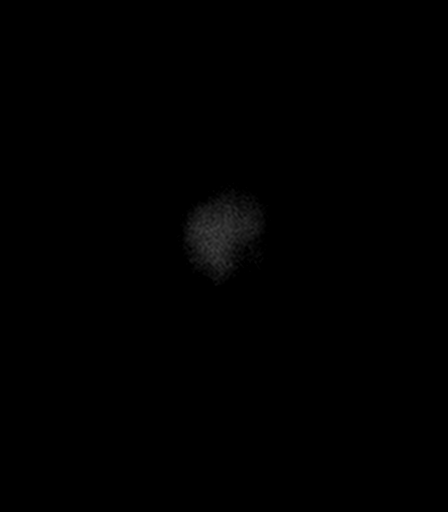
[im 15/29]
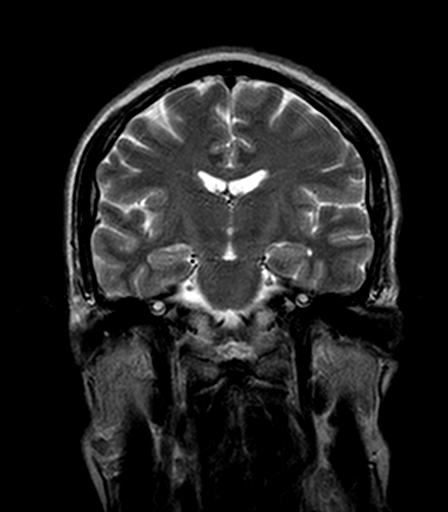
[im 29/29]
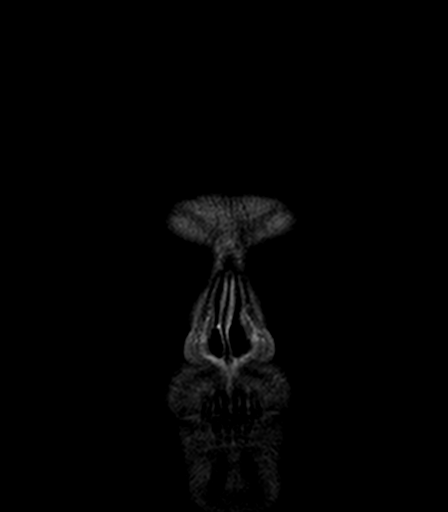

[Series 11: FLAIR · axial · 5.0mm · 0.90mm/px · z∈[-85,+84]mm · 3 of 27 slices shown]
[im 1/27]
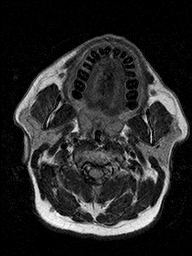
[im 14/27]
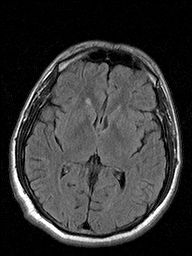
[im 27/27]
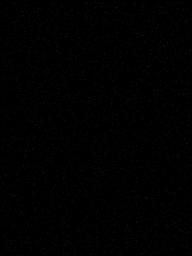

[Series 12: T2 · axial · 5.0mm · 0.45mm/px · z∈[-85,+84]mm · 3 of 27 slices shown (3 of 3)]
[im 1/27]
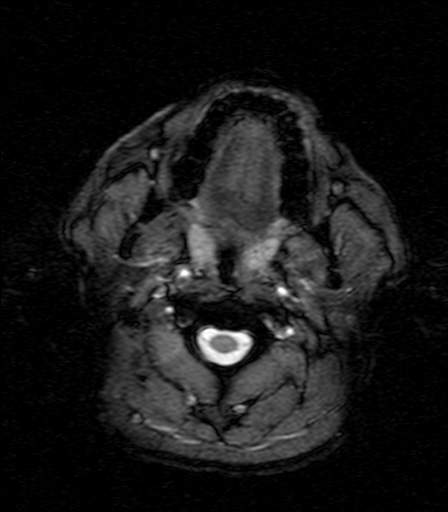
[im 14/27]
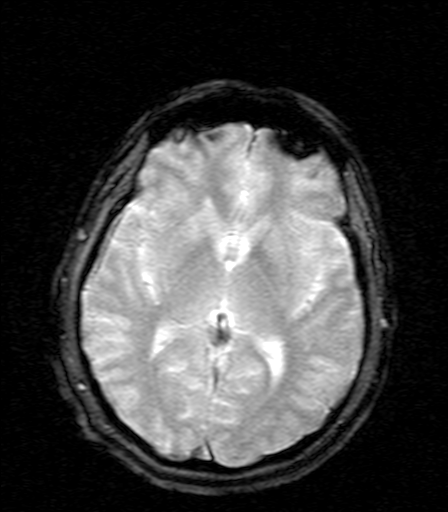
[im 27/27]
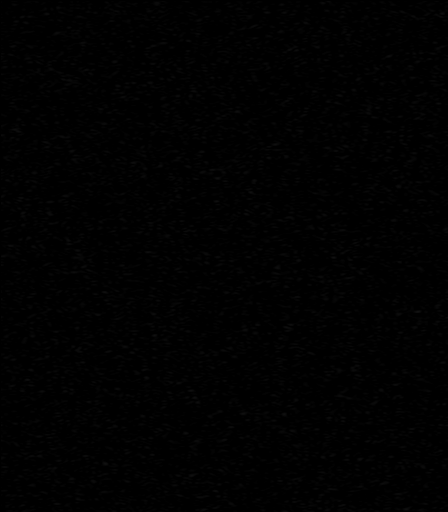

[Series 13: T1 · axial · 3.0mm · 0.45mm/px · z∈[-89,+88]mm · 6 of 60 slices shown (2 of 2)]
[im 1/60]
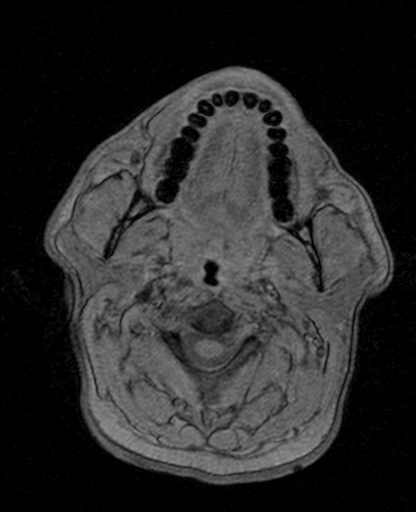
[im 12/60]
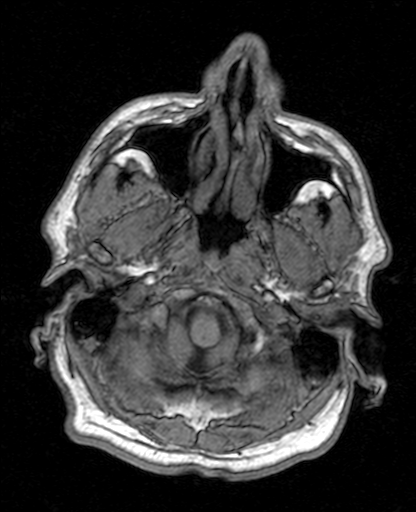
[im 24/60]
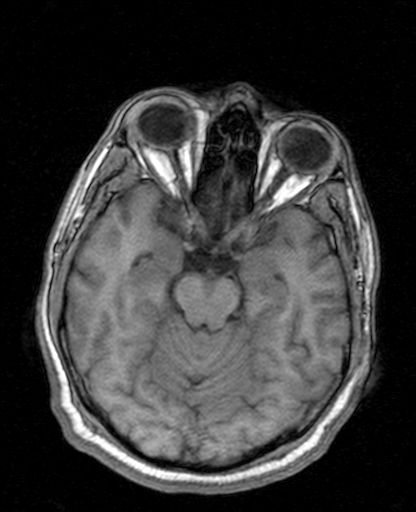
[im 36/60]
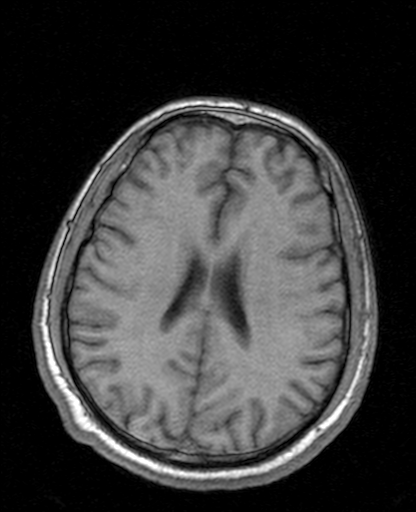
[im 48/60]
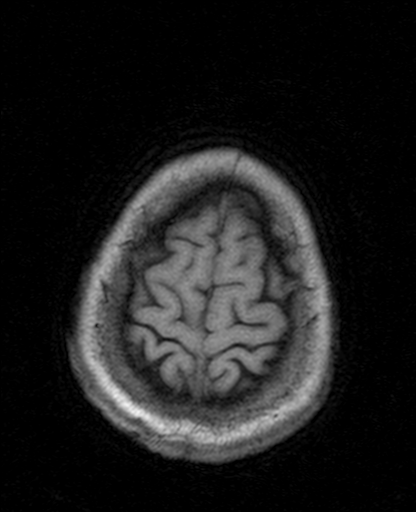
[im 60/60]
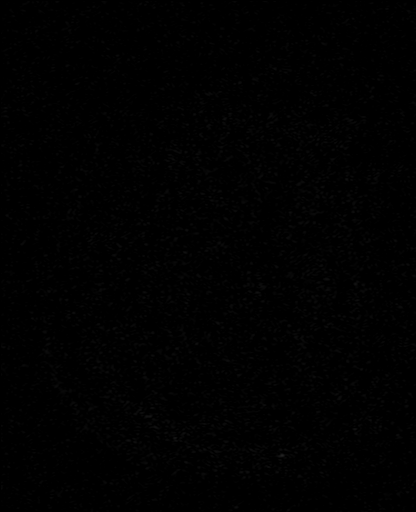

[Series 14: TOF · axial · 0.8mm · 0.39mm/px · z∈[-63,-46]mm · 2 of 120 slices shown]
[im 1/120]
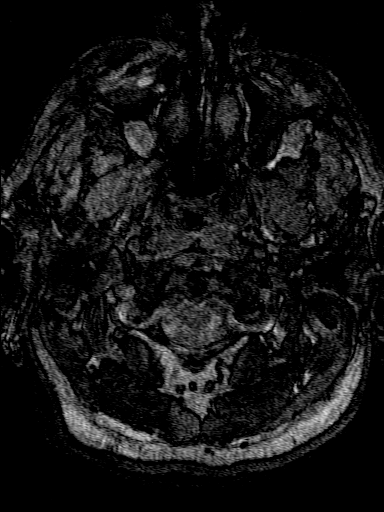
[im 22/120]
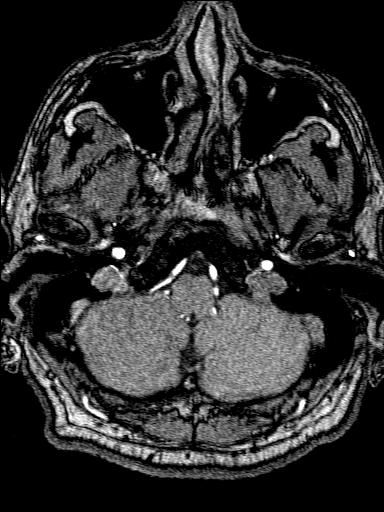

[38 of 48 positions shown; findings below may reference images not displayed]

FINDINGS: Both vertebral arteries patent to the basilar. PICA patent
bilaterally. Basilar widely patent. Superior cerebellar and
posterior cerebral arteries patent bilaterally.

Cavernous carotid widely patent without stenosis or aneurysm.
Anterior and middle cerebral arteries patent bilaterally without
stenosis.

Negative for vascular malformation.
IMPRESSION: Negative MRA head

## 2018-06-22 ENCOUNTER — Encounter: Payer: Self-pay | Admitting: Emergency Medicine

## 2018-06-22 ENCOUNTER — Other Ambulatory Visit: Payer: Self-pay

## 2018-06-22 ENCOUNTER — Emergency Department
Admission: EM | Admit: 2018-06-22 | Discharge: 2018-06-22 | Disposition: A | Discharge status: Home / Self Care | Payer: BLUE CROSS/BLUE SHIELD | Attending: Emergency Medicine | Admitting: Emergency Medicine

## 2018-06-22 DIAGNOSIS — H6123 Impacted cerumen, bilateral: Secondary | ICD-10-CM | POA: Diagnosis not present

## 2018-06-22 DIAGNOSIS — Z79899 Other long term (current) drug therapy: Secondary | ICD-10-CM | POA: Diagnosis not present

## 2018-06-22 DIAGNOSIS — F17210 Nicotine dependence, cigarettes, uncomplicated: Secondary | ICD-10-CM | POA: Diagnosis not present

## 2018-06-22 DIAGNOSIS — M436 Torticollis: Secondary | ICD-10-CM | POA: Diagnosis not present

## 2018-06-22 DIAGNOSIS — R42 Dizziness and giddiness: Secondary | ICD-10-CM | POA: Insufficient documentation

## 2018-06-22 LAB — URINALYSIS, COMPLETE (UACMP) WITH MICROSCOPIC
BACTERIA UA: NONE SEEN
BILIRUBIN URINE: NEGATIVE
GLUCOSE, UA: NEGATIVE mg/dL
Hgb urine dipstick: NEGATIVE
Ketones, ur: NEGATIVE mg/dL
Leukocytes, UA: NEGATIVE
NITRITE: NEGATIVE
PH: 7 (ref 5.0–8.0)
Protein, ur: NEGATIVE mg/dL
SQUAMOUS EPITHELIAL / LPF: NONE SEEN (ref 0–5)
Specific Gravity, Urine: 1.013 (ref 1.005–1.030)

## 2018-06-22 LAB — CBC
HCT: 43.9 % (ref 40.0–52.0)
Hemoglobin: 15.4 g/dL (ref 13.0–18.0)
MCH: 31.4 pg (ref 26.0–34.0)
MCHC: 35.2 g/dL (ref 32.0–36.0)
MCV: 89.1 fL (ref 80.0–100.0)
PLATELETS: 231 10*3/uL (ref 150–440)
RBC: 4.92 MIL/uL (ref 4.40–5.90)
RDW: 13.4 % (ref 11.5–14.5)
WBC: 10.7 10*3/uL — ABNORMAL HIGH (ref 3.8–10.6)

## 2018-06-22 LAB — BASIC METABOLIC PANEL
Anion gap: 8 (ref 5–15)
BUN: 17 mg/dL (ref 6–20)
CALCIUM: 9.3 mg/dL (ref 8.9–10.3)
CO2: 26 mmol/L (ref 22–32)
CREATININE: 0.88 mg/dL (ref 0.61–1.24)
Chloride: 106 mmol/L (ref 98–111)
GFR calc Af Amer: >60 mL/min (ref >60)
GFR calc non Af Amer: >60 mL/min (ref >60)
Glucose, Bld: 141 mg/dL — ABNORMAL HIGH (ref 70–99)
Potassium: 3.3 mmol/L — ABNORMAL LOW (ref 3.5–5.1)
SODIUM: 140 mmol/L (ref 135–145)

## 2018-06-22 LAB — GLUCOSE, CAPILLARY: Glucose-Capillary: 134 mg/dL — ABNORMAL HIGH (ref 70–99)

## 2018-06-22 MED ORDER — DIAZEPAM 2 MG PO TABS
2.0000 mg | ORAL_TABLET | Freq: Once | ORAL | Status: AC
  Administered 2018-06-22: 2 mg via ORAL
  Filled 2018-06-22: qty 1

## 2018-06-22 MED ORDER — ONDANSETRON 4 MG PO TBDP
4.0000 mg | ORAL_TABLET | Freq: Once | ORAL | Status: AC
Start: 2018-06-22 — End: 2018-06-22
  Administered 2018-06-22: 4 mg via ORAL
  Filled 2018-06-22: qty 1

## 2018-06-22 MED ORDER — IBUPROFEN 600 MG PO TABS: 600.0000 mg | ORAL_TABLET | Freq: Three times a day (TID) | ORAL | 0 refills | Status: DC | PRN

## 2018-06-22 MED ORDER — DIAZEPAM 2 MG PO TABS: 2.0000 mg | ORAL_TABLET | Freq: Three times a day (TID) | ORAL | 0 refills | Status: DC | PRN

## 2018-06-22 NOTE — ED Provider Notes (Signed)
[facility] Emergency Department Provider Note   ____________________________________________   First MD Initiated Contact with Patient 06/22/18 0421     (approximate)  I have reviewed the triage vital signs and the nursing notes.   HISTORY  Chief Complaint Neck Pain; Muscle Pain; and Dizziness    HPI Paul Hayes is a 42 y.o. male who presents to the ED from home with a chief complaint of dizziness and muscle cramps.  Patient reports feeling dizzy like the room was spinning yesterday.  Symptoms associated with nausea.  Had been developing cold-like symptoms including nasal congestion and dry cough.  Took a nap last evening and awoke with the right side of his neck hurting.  Hurts to turn his neck to the right.  Denies fever, chills, chest pain, shortness of breath, abdominal pain, nausea, vomiting, headache, numbness/tingling, extremity weakness.  Denies recent travel or trauma.   Past medical history None  There are no active problems to display for this patient.   History reviewed. No pertinent surgical history.  Prior to Admission medications   Medication Sig Start Date End Date Taking? Authorizing Provider  albuterol (PROVENTIL HFA;VENTOLIN HFA) 108 (90 Base) MCG/ACT inhaler Inhale 2 puffs into the lungs every 6 (six) hours as needed for wheezing or shortness of breath. Patient not taking: Reported on 02/05/2018 03/11/16   Minna Antis, MD  azithromycin (ZITHROMAX Z-PAK) 250 MG tablet Take 2 tablets (500 mg) on  Day 1,  followed by 1 tablet (250 mg) once daily on Days 2 through 5. Patient not taking: Reported on 02/05/2018 09/29/15   Menshew, Charlesetta Ivory, PA-C  diazepam (VALIUM) 2 MG tablet Take 1 tablet (2 mg total) by mouth every 8 (eight) hours as needed for muscle spasms. 06/22/18   Irean Hong, MD  ibuprofen (ADVIL,MOTRIN) 600 MG tablet Take 1 tablet (600 mg total) by mouth every 8 (eight) hours as needed. 06/22/18   Irean Hong,  MD  predniSONE (DELTASONE) 20 MG tablet Take 2 tablets (40 mg total) by mouth daily. Patient not taking: Reported on 02/05/2018 03/11/16   Minna Antis, MD  predniSONE (STERAPRED UNI-PAK 21 TAB) 10 MG (21) TBPK tablet Take by mouth daily. Dispense steroid taper pack as directed 02/05/18   Emily Filbert, MD    Allergies Patient has no known allergies.  No family history on file.  Social History Social History   Tobacco Use  . Smoking status: Current Every Day Smoker    Packs/day: 0.10    Types: Cigarettes  . Smokeless tobacco: Never Used  Substance Use Topics  . Alcohol use: Yes  . Drug use: Not Currently    Review of Systems  Constitutional: No fever/chills Eyes: No visual changes. ENT: Positive for nasal congestion.  No sore throat. Cardiovascular: Denies chest pain. Respiratory: Denies shortness of breath. Gastrointestinal: No abdominal pain.  No nausea, no vomiting.  No diarrhea.  No constipation. Genitourinary: Negative for dysuria. Musculoskeletal: Positive for cramps in his neck.  Negative for back pain. Skin: Negative for rash. Neurological: Negative for headaches, focal weakness or numbness.   ____________________________________________   PHYSICAL EXAM:  VITAL SIGNS: ED Triage Vitals  Enc Vitals Group     BP 06/22/18 0115 118/78     Pulse Rate 06/22/18 0112 82     Resp 06/22/18 0112 20     Temp 06/22/18 0112 98 F (36.7 C)     Temp Source 06/22/18 0112 Oral     SpO2 06/22/18 0112  96 %     Weight 06/22/18 0114 160 lb (72.6 kg)     Height 06/22/18 0114 5\' 2"  (1.575 m)     Head Circumference --      Peak Flow --      Pain Score 06/22/18 0114 8     Pain Loc --      Pain Edu? --      Excl. in GC? --     Constitutional: Asleep, awakened for exam.  Alert and oriented. Well appearing and in no acute distress. Eyes: Conjunctivae are normal. PERRL. EOMI. Head: Atraumatic. Ears: Bilateral cerumen impaction. Nose:  Congestion/rhinnorhea. Mouth/Throat: Mucous membranes are moist.  Oropharynx non-erythematous. Neck: No stridor.  No cervical spine tenderness to palpation.  Right trapezius muscle spasm.  Painful to turn neck towards the right with resistance.  No carotid bruits. Cardiovascular: Normal rate, regular rhythm. Grossly normal heart sounds.  Good peripheral circulation. Respiratory: Normal respiratory effort.  No retractions. Lungs CTAB. Gastrointestinal: Soft and nontender. No distention. No abdominal bruits. No CVA tenderness. Musculoskeletal: No lower extremity tenderness nor edema.  No joint effusions. Neurologic:  Normal speech and language. No gross focal neurologic deficits are appreciated. No gait instability. Skin:  Skin is warm, dry and intact. No rash noted. Psychiatric: Mood and affect are normal. Speech and behavior are normal.  ____________________________________________   LABS (all labs ordered are listed, but only abnormal results are displayed)  Labs Reviewed  BASIC METABOLIC PANEL - Abnormal; Notable for the following components:      Result Value   Potassium 3.3 (*)    Glucose, Bld 141 (*)    All other components within normal limits  CBC - Abnormal; Notable for the following components:   WBC 10.7 (*)    All other components within normal limits  URINALYSIS, COMPLETE (UACMP) WITH MICROSCOPIC - Abnormal; Notable for the following components:   Color, Urine YELLOW (*)    APPearance CLEAR (*)    All other components within normal limits  GLUCOSE, CAPILLARY - Abnormal; Notable for the following components:   Glucose-Capillary 134 (*)    All other components within normal limits  CBG MONITORING, ED   ____________________________________________  EKG  ED ECG REPORT I, Loann Chahal J, the attending physician, personally viewed and interpreted this ECG.   Date: 06/22/2018  EKG Time: 0118  Rate: 79  Rhythm: normal EKG, normal sinus rhythm  Axis: Normal   Intervals:none  ST&T Change: Nonspecific  ____________________________________________  RADIOLOGY  ED MD interpretation: None  Official radiology report(s): No results found.  ____________________________________________   PROCEDURES  Procedure(s) performed: None  Procedures  Critical Care performed: No  ____________________________________________   INITIAL IMPRESSION / ASSESSMENT AND PLAN / ED COURSE  As part of my medical decision making, I reviewed the following data within the electronic MEDICAL RECORD NUMBER Nursing notes reviewed and incorporated, Labs reviewed, EKG interpreted, Old chart reviewed and Notes from prior ED visits   42 year old male who presents with dizziness, cerumen impaction, right trapezius muscle spasm.  Patient is alert and oriented and has no focal neurologic deficits.  Low suspicion for ICH, CVA, TIA.  Dizziness sounds like vertigo which is most likely secondary to cerumen impaction.  Nursing to clean the ears bilaterally.  Will administer oral Valium and Zofran for dizziness/muscle spasm and nausea.   Clinical Course as of Jun 22 701  Sat Jun 22, 2018  0702 Patient was feeling better and discharged home in good condition.  Return precautions were given.  Patient  verbalized understanding and agree with plan of care.   [JS]    Clinical Course User Index [JS] Irean Hong, MD     ____________________________________________   FINAL CLINICAL IMPRESSION(S) / ED DIAGNOSES  Final diagnoses:  Bilateral impacted cerumen  Vertigo  Torticollis     ED Discharge Orders         Ordered    diazepam (VALIUM) 2 MG tablet  Every 8 hours PRN     06/22/18 0451    ibuprofen (ADVIL,MOTRIN) 600 MG tablet  Every 8 hours PRN     06/22/18 0451           Note:  This document was prepared using Dragon voice recognition software and may include unintentional dictation errors.    Irean Hong, MD 06/22/18 [phone]

## 2018-06-22 NOTE — Discharge Instructions (Signed)
1.  You may take medicines as needed for muscle spasms, dizziness and nausea (Valium/Zofran). 2.  Apply moist heat to affected area several times daily. 3.  I recommend using this over-the-counter product remove the earwax buildup - Debrox. 4.  Return to the ER for worsening symptoms, persistent vomiting, difficulty breathing or other concerns.

## 2018-06-22 NOTE — ED Triage Notes (Signed)
Pt reports neck pain since yesterday reports feels dizziness and muscles cramps. Denies any other symptom at present

## 2018-09-20 ENCOUNTER — Other Ambulatory Visit: Payer: Self-pay

## 2018-09-20 DIAGNOSIS — R42 Dizziness and giddiness: Secondary | ICD-10-CM | POA: Diagnosis not present

## 2018-09-20 DIAGNOSIS — R002 Palpitations: Secondary | ICD-10-CM | POA: Diagnosis present

## 2018-09-20 DIAGNOSIS — E876 Hypokalemia: Secondary | ICD-10-CM | POA: Insufficient documentation

## 2018-09-20 DIAGNOSIS — E0590 Thyrotoxicosis, unspecified without thyrotoxic crisis or storm: Secondary | ICD-10-CM | POA: Diagnosis not present

## 2018-09-20 DIAGNOSIS — F17210 Nicotine dependence, cigarettes, uncomplicated: Secondary | ICD-10-CM | POA: Insufficient documentation

## 2018-09-20 LAB — URINALYSIS, COMPLETE (UACMP) WITH MICROSCOPIC
BACTERIA UA: NONE SEEN
Bilirubin Urine: NEGATIVE
Glucose, UA: NEGATIVE mg/dL
HGB URINE DIPSTICK: NEGATIVE
Ketones, ur: NEGATIVE mg/dL
Leukocytes, UA: NEGATIVE
NITRITE: NEGATIVE
PROTEIN: NEGATIVE mg/dL
Specific Gravity, Urine: 1.015 (ref 1.005–1.030)
Squamous Epithelial / LPF: NONE SEEN (ref 0–5)
pH: 6 (ref 5.0–8.0)

## 2018-09-20 LAB — CBC
HEMATOCRIT: 42.3 % (ref 39.0–52.0)
HEMOGLOBIN: 14.4 g/dL (ref 13.0–17.0)
MCH: 29.6 pg (ref 26.0–34.0)
MCHC: 34 g/dL (ref 30.0–36.0)
MCV: 87 fL (ref 80.0–100.0)
Platelets: 304 10*3/uL (ref 150–400)
RBC: 4.86 MIL/uL (ref 4.22–5.81)
RDW: 11.7 % (ref 11.5–15.5)
WBC: 9.6 10*3/uL (ref 4.0–10.5)
nRBC: 0 % (ref 0.0–0.2)

## 2018-09-20 LAB — COMPREHENSIVE METABOLIC PANEL
ALBUMIN: 3.8 g/dL (ref 3.5–5.0)
ALK PHOS: 75 U/L (ref 38–126)
ALT: 36 U/L (ref 0–44)
ANION GAP: 9 (ref 5–15)
AST: 26 U/L (ref 15–41)
BILIRUBIN TOTAL: 0.5 mg/dL (ref 0.3–1.2)
BUN: 18 mg/dL (ref 6–20)
CALCIUM: 9.3 mg/dL (ref 8.9–10.3)
CO2: 26 mmol/L (ref 22–32)
Chloride: 102 mmol/L (ref 98–111)
Creatinine, Ser: 0.68 mg/dL (ref 0.61–1.24)
GFR calc non Af Amer: >60 mL/min (ref >60)
GLUCOSE: 153 mg/dL — AB (ref 70–99)
Potassium: 3 mmol/L — ABNORMAL LOW (ref 3.5–5.1)
Sodium: 137 mmol/L (ref 135–145)
Total Protein: 8.1 g/dL (ref 6.5–8.1)

## 2018-09-20 LAB — TROPONIN I: Troponin I: <0.03 ng/mL (ref <0.03)

## 2018-09-20 NOTE — ED Triage Notes (Signed)
Pt in with co dizziness and palpitations since yesterday no hx of the same. States generalized weakness and nausea since, no vomiting.

## 2018-09-21 ENCOUNTER — Emergency Department: Payer: BLUE CROSS/BLUE SHIELD

## 2018-09-21 ENCOUNTER — Emergency Department
Admission: EM | Admit: 2018-09-21 | Discharge: 2018-09-21 | Disposition: A | Discharge status: Home / Self Care | Payer: BLUE CROSS/BLUE SHIELD | Attending: Emergency Medicine | Admitting: Emergency Medicine

## 2018-09-21 DIAGNOSIS — R002 Palpitations: Secondary | ICD-10-CM

## 2018-09-21 DIAGNOSIS — R42 Dizziness and giddiness: Secondary | ICD-10-CM

## 2018-09-21 DIAGNOSIS — E0590 Thyrotoxicosis, unspecified without thyrotoxic crisis or storm: Secondary | ICD-10-CM

## 2018-09-21 DIAGNOSIS — E876 Hypokalemia: Secondary | ICD-10-CM

## 2018-09-21 LAB — URINE DRUG SCREEN, QUALITATIVE (ARMC ONLY)
AMPHETAMINES, UR SCREEN: NOT DETECTED
Barbiturates, Ur Screen: NOT DETECTED
Benzodiazepine, Ur Scrn: NOT DETECTED
CANNABINOID 50 NG, UR ~~LOC~~: NOT DETECTED
COCAINE METABOLITE, UR ~~LOC~~: NOT DETECTED
MDMA (ECSTASY) UR SCREEN: NOT DETECTED
Methadone Scn, Ur: NOT DETECTED
OPIATE, UR SCREEN: NOT DETECTED
Phencyclidine (PCP) Ur S: NOT DETECTED
Tricyclic, Ur Screen: NOT DETECTED

## 2018-09-21 LAB — T4, FREE: FREE T4: 4.42 ng/dL — AB (ref 0.82–1.77)

## 2018-09-21 LAB — TSH: TSH: <0.01 u[IU]/mL — ABNORMAL LOW (ref 0.350–4.500)

## 2018-09-21 LAB — ETHANOL

## 2018-09-21 MED ORDER — LORAZEPAM 2 MG/ML IJ SOLN
0.5000 mg | Freq: Once | INTRAMUSCULAR | Status: AC
  Administered 2018-09-21: 0.5 mg via INTRAVENOUS
  Filled 2018-09-21: qty 1

## 2018-09-21 MED ORDER — POTASSIUM CHLORIDE CRYS ER 20 MEQ PO TBCR
40.0000 meq | EXTENDED_RELEASE_TABLET | Freq: Once | ORAL | Status: AC
  Administered 2018-09-21: 40 meq via ORAL
  Filled 2018-09-21: qty 2

## 2018-09-21 MED ORDER — PROPRANOLOL HCL 1 MG/ML IV SOLN
1.0000 mg | Freq: Once | INTRAVENOUS | Status: AC
  Administered 2018-09-21: 1 mg via INTRAVENOUS
  Filled 2018-09-21: qty 1

## 2018-09-21 MED ORDER — SODIUM CHLORIDE 0.9 % IV BOLUS
1000.0000 mL | Freq: Once | INTRAVENOUS | Status: AC
  Administered 2018-09-21: 1000 mL via INTRAVENOUS

## 2018-09-21 MED ORDER — PROPRANOLOL HCL 20 MG PO TABS: 20.0000 mg | ORAL_TABLET | Freq: Three times a day (TID) | ORAL | 0 refills | Status: DC

## 2018-09-21 NOTE — ED Notes (Addendum)
Interpreter DC from [phone] to this time;

## 2018-09-21 NOTE — ED Notes (Signed)
Peripheral IV discontinued. Catheter intact. No signs of infiltration or redness. Gauze applied to IV site.   Discharge instructions reviewed with patient. Questions fielded by this RN. Patient verbalizes understanding of instructions. Patient discharged home in stable condition per sung. No acute distress noted at time of discharge.    

## 2018-09-21 NOTE — Discharge Instructions (Addendum)
1.  Take Propranolol 20mg  3 times daily (#90). 2.  Avoid caffeine intake. 3.  Please call the number provided to make an appointment with a specialist to further evaluate your thyroid. 4.  Return to the ER for worsening symptoms, persistent vomiting, difficulty breathing or other concerns.

## 2018-09-21 NOTE — ED Provider Notes (Signed)
[facility] Emergency Department Provider Note   ____________________________________________   First MD Initiated Contact with Patient 09/21/18 0159     (approximate)  I have reviewed the triage vital signs and the nursing notes.   HISTORY  Chief Complaint Palpitations and Dizziness  History obtained via [employer] Spanish Interpreter  HPI Paul Hayes is a 42 y.o. male who presents to the ED from home with a chief complaint of palpitations and dizziness.  Patient reports a 6-month history of palpitations to his left chest.  Has not seek medical evaluation for it yet.  Today complaining of dizziness like the room is spinning around him and feeling tired.  Denies associated fever, chills, vision changes, neck pain, chest pain, shortness of breath, abdominal pain, nausea or vomiting.  Denies recent travel or trauma.  Drinks coffee in the morning and an occasional red bull energy drink. Denies hormone use.   Past medical history None  There are no active problems to display for this patient.   No past surgical history on file.  Prior to Admission medications   Medication Sig Start Date End Date Taking? Authorizing Provider  albuterol (PROVENTIL HFA;VENTOLIN HFA) 108 (90 Base) MCG/ACT inhaler Inhale 2 puffs into the lungs every 6 (six) hours as needed for wheezing or shortness of breath. Patient not taking: Reported on 02/05/2018 03/11/16   Minna Antis, MD  azithromycin (ZITHROMAX Z-PAK) 250 MG tablet Take 2 tablets (500 mg) on  Day 1,  followed by 1 tablet (250 mg) once daily on Days 2 through 5. Patient not taking: Reported on 02/05/2018 09/29/15   Menshew, Charlesetta Ivory, PA-C  diazepam (VALIUM) 2 MG tablet Take 1 tablet (2 mg total) by mouth every 8 (eight) hours as needed for muscle spasms. 06/22/18   Irean Hong, MD  ibuprofen (ADVIL,MOTRIN) 600 MG tablet Take 1 tablet (600 mg total) by mouth every 8 (eight) hours as needed. 06/22/18   Irean Hong, MD  predniSONE (DELTASONE) 20 MG tablet Take 2 tablets (40 mg total) by mouth daily. Patient not taking: Reported on 02/05/2018 03/11/16   Minna Antis, MD  predniSONE (STERAPRED UNI-PAK 21 TAB) 10 MG (21) TBPK tablet Take by mouth daily. Dispense steroid taper pack as directed 02/05/18   Emily Filbert, MD  propranolol (INDERAL) 20 MG tablet Take 1 tablet (20 mg total) by mouth 3 (three) times daily. 09/21/18   Irean Hong, MD    Allergies Patient has no known allergies.  No family history on file.  Social History Social History   Tobacco Use  . Smoking status: Current Every Day Smoker    Packs/day: 0.10    Types: Cigarettes  . Smokeless tobacco: Never Used  Substance Use Topics  . Alcohol use: Yes  . Drug use: Not Currently    Review of Systems  Constitutional: No fever/chills Eyes: No visual changes. ENT: No sore throat. Cardiovascular: Positive for palpitations. Denies chest pain. Respiratory: Denies shortness of breath. Gastrointestinal: No abdominal pain.  No nausea, no vomiting.  No diarrhea.  No constipation. Genitourinary: Negative for dysuria. Musculoskeletal: Negative for back pain. Skin: Negative for rash. Neurological: Positive for dizziness. Negative for headaches, focal weakness or numbness.   ____________________________________________   PHYSICAL EXAM:  VITAL SIGNS: ED Triage Vitals  Enc Vitals Group     BP 09/20/18 2216 140/87     Pulse Rate 09/20/18 2216 97     Resp 09/20/18 2216 20     Temp 09/20/18  2216 98.3 F (36.8 C)     Temp Source 09/20/18 2216 Oral     SpO2 09/20/18 2216 99 %     Weight 09/20/18 2210 160 lb (72.6 kg)     Height --      Head Circumference --      Peak Flow --      Pain Score 09/20/18 2210 0     Pain Loc --      Pain Edu? --      Excl. in GC? --     Constitutional: Alert and oriented. Well appearing and in no acute distress. Eyes: Conjunctivae are normal. PERRL. EOMI. Head: Atraumatic. Nose:  No congestion/rhinnorhea. Mouth/Throat: Mucous membranes are moist.  Oropharynx non-erythematous. Neck: No stridor.  No carotid bruits. Cardiovascular: Normal rate, regular rhythm. Grossly normal heart sounds.  Good peripheral circulation. Respiratory: Normal respiratory effort.  No retractions. Lungs CTAB. Gastrointestinal: Soft and nontender. No distention. No abdominal bruits. No CVA tenderness. Musculoskeletal: No lower extremity tenderness nor edema.  No joint effusions. Neurologic: Alert and oriented x3.  CN II-XII grossly intact.  Normal speech and language. No gross focal neurologic deficits are appreciated. No gait instability. Skin:  Skin is warm, dry and intact. No rash noted. Psychiatric: Mood and affect are normal. Speech and behavior are normal.  ____________________________________________   LABS (all labs ordered are listed, but only abnormal results are displayed)  Labs Reviewed  COMPREHENSIVE METABOLIC PANEL - Abnormal; Notable for the following components:      Result Value   Potassium 3.0 (*)    Glucose, Bld 153 (*)    All other components within normal limits  URINALYSIS, COMPLETE (UACMP) WITH MICROSCOPIC - Abnormal; Notable for the following components:   Color, Urine YELLOW (*)    APPearance CLEAR (*)    All other components within normal limits  TSH - Abnormal; Notable for the following components:   TSH <0.010 (*)    All other components within normal limits  T4, FREE - Abnormal; Notable for the following components:   Free T4 4.42 (*)    All other components within normal limits  CBC  TROPONIN I  ETHANOL  URINE DRUG SCREEN, QUALITATIVE ([facility] ONLY)   ____________________________________________  EKG  ED ECG REPORT I, Jourdan Maldonado J, the attending physician, personally viewed and interpreted this ECG.   Date: 09/21/2018  EKG Time: 2210  Rate: 92  Rhythm: normal EKG, normal sinus rhythm  Axis: Normal  Intervals:none  ST&T Change:  Nonspecific  ____________________________________________  RADIOLOGY  ED MD interpretation: No ICH, chest x-ray with questionable lung nodules; CT chest unremarkable except for mild blebs at lung apices  Official radiology report(s): Dg Chest 2 View  Result Date: 09/21/2018 CLINICAL DATA:  Acute onset of dizziness and palpitations. Generalized weakness and nausea. EXAM: CHEST - 2 VIEW COMPARISON:  Chest radiograph performed 02/05/2018 FINDINGS: The lungs are well-aerated. Mild peripheral nodularity is suggested bilaterally, of uncertain significance. There is no evidence of pleural effusion or pneumothorax. The heart is normal in size; the mediastinal contour is within normal limits. No acute osseous abnormalities are seen. IMPRESSION: Mild peripheral nodularity suggested bilaterally, of uncertain significance. This could reflect mild infection, though CT of the chest could be considered to assess for underlying pulmonary nodules. Electronically Signed   By: Roanna Raider M.D.   On: 09/21/2018 02:40   Ct Head Wo Contrast  Result Date: 09/21/2018 CLINICAL DATA:  Dizziness and palpitations EXAM: CT HEAD WITHOUT CONTRAST TECHNIQUE: Contiguous axial images were obtained  from the base of the skull through the vertex without intravenous contrast. COMPARISON:  Head CT 01/17/2016 FINDINGS: Brain: There is no mass, hemorrhage or extra-axial collection. The size and configuration of the ventricles and extra-axial CSF spaces are normal. The brain parenchyma is normal, without evidence of acute or chronic infarction. Vascular: No abnormal hyperdensity of the major intracranial arteries or dural venous sinuses. No intracranial atherosclerosis. Skull: The visualized skull base, calvarium and extracranial soft tissues are normal. Sinuses/Orbits: No fluid levels or advanced mucosal thickening of the visualized paranasal sinuses. No mastoid or middle ear effusion. The orbits are normal. IMPRESSION: Normal head CT.  Electronically Signed   By: Deatra RobinsonKevin  Herman M.D.   On: 09/21/2018 03:22   Ct Chest Wo Contrast  Result Date: 09/21/2018 CLINICAL DATA:  Acute onset of dizziness and palpitations. Generalized weakness and nausea. EXAM: CT CHEST WITHOUT CONTRAST TECHNIQUE: Multidetector CT imaging of the chest was performed following the standard protocol without IV contrast. COMPARISON:  Chest radiograph performed earlier today at 2:27 a.m. FINDINGS: Cardiovascular: The heart is normal in size. The thoracic aorta is unremarkable. The great vessels are within normal limits. Mediastinum/Nodes: The mediastinum is unremarkable in appearance. Scattered superior mediastinal nodes are normal in size. No mediastinal lymphadenopathy is seen. No pericardial effusion is identified. The thyroid gland is unremarkable in appearance. No axillary lymphadenopathy is seen. Lungs/Pleura: The lungs are clear bilaterally. No focal consolidation, pleural effusion or pneumothorax is seen. Mild blebs are noted at the lung apices. No masses are identified. An intrafissural lymph node is noted along the right major fissure. Upper Abdomen: The visualized portions of the liver and spleen are unremarkable. The gallbladder is decompressed unremarkable in appearance. The pancreas, adrenal glands and visualized portions of the kidneys are within normal limits. Musculoskeletal: No acute osseous abnormalities are identified. The visualized musculature is unremarkable in appearance. IMPRESSION: 1. No acute abnormality seen within the chest. 2. Mild blebs at the lung apices. Lungs otherwise clear. Electronically Signed   By: Roanna RaiderJeffery  Chang M.D.   On: 09/21/2018 03:33    ____________________________________________   PROCEDURES  Procedure(s) performed: None  Procedures  Critical Care performed: No  ____________________________________________   INITIAL IMPRESSION / ASSESSMENT AND PLAN / ED COURSE  As part of my medical decision making, I reviewed  the following data within the electronic MEDICAL RECORD NUMBER Nursing notes reviewed and incorporated, Labs reviewed, EKG interpreted, Old chart reviewed, Radiograph reviewed and Notes from prior ED visits   42 year old Hispanic male who presents with a two-month history of palpitations and now dizziness. Differential diagnosis includes, but is not limited to, ACS, aortic dissection, pulmonary embolism, cardiac tamponade, pneumothorax, pneumonia, pericarditis, myocarditis, GI-related causes including esophagitis/gastritis, and musculoskeletal chest wall pain.    Will obtain screening lab work, CT head, x-ray and reassess.  Patient's pulse rate noted to increased 20 points upon my arrival to the treatment room.  We will add IV fluids and low-dose Ativan.   Clinical Course as of Sep 21 438  Sat Sep 21, 2018  0404 Updated patient of all test results.  Patient's heart rate spikes to 105-110 upon my arrival.  Will try 1 mg IV propranolol.  Will defer considerations for radioactive iodine, PTU, methimazole to endocrinology.   [JS]  0438 Heart rate down to the 90s.  Will discharge home with prescription for propranolol and endocrinology follow-up as an outpatient.  Strict return precautions given.  Patient verbalizes understanding and agrees with plan of care.   [JS]  Clinical Course User Index [JS] Irean HongSung, Armando Bukhari J, MD     ____________________________________________   FINAL CLINICAL IMPRESSION(S) / ED DIAGNOSES  Final diagnoses:  Palpitations  Hyperthyroidism  Dizziness  Hypokalemia     ED Discharge Orders         Ordered    propranolol (INDERAL) 20 MG tablet  3 times daily     09/21/18 0402           Note:  This document was prepared using Dragon voice recognition software and may include unintentional dictation errors.    Irean HongSung, Nayely Dingus J, MD 09/21/18 [phone]

## 2018-09-21 NOTE — ED Notes (Addendum)
ED Provider at bedside.  Assessing with interpreter  Pt reports dizziness, tired and now HA 8/10, earlier today and over the last 2 months palpitations left of center chest  Pt denies drug use, occasional beer and caffeine use

## 2018-12-06 ENCOUNTER — Emergency Department: Payer: BLUE CROSS/BLUE SHIELD

## 2018-12-06 ENCOUNTER — Other Ambulatory Visit: Payer: Self-pay

## 2018-12-06 DIAGNOSIS — R079 Chest pain, unspecified: Secondary | ICD-10-CM | POA: Insufficient documentation

## 2018-12-06 DIAGNOSIS — Z79899 Other long term (current) drug therapy: Secondary | ICD-10-CM | POA: Insufficient documentation

## 2018-12-06 DIAGNOSIS — Z87891 Personal history of nicotine dependence: Secondary | ICD-10-CM | POA: Diagnosis not present

## 2018-12-06 DIAGNOSIS — R0981 Nasal congestion: Secondary | ICD-10-CM | POA: Diagnosis not present

## 2018-12-06 DIAGNOSIS — R05 Cough: Secondary | ICD-10-CM | POA: Diagnosis not present

## 2018-12-06 LAB — CBC
HCT: 45.9 % (ref 39.0–52.0)
Hemoglobin: 15.9 g/dL (ref 13.0–17.0)
MCH: 30 pg (ref 26.0–34.0)
MCHC: 34.6 g/dL (ref 30.0–36.0)
MCV: 86.6 fL (ref 80.0–100.0)
Platelets: 221 10*3/uL (ref 150–400)
RBC: 5.3 MIL/uL (ref 4.22–5.81)
RDW: 14.2 % (ref 11.5–15.5)
WBC: 10.4 10*3/uL (ref 4.0–10.5)
nRBC: 0 % (ref 0.0–0.2)

## 2018-12-06 LAB — BASIC METABOLIC PANEL
ANION GAP: 9 (ref 5–15)
BUN: 22 mg/dL — ABNORMAL HIGH (ref 6–20)
CO2: 23 mmol/L (ref 22–32)
Calcium: 9 mg/dL (ref 8.9–10.3)
Chloride: 104 mmol/L (ref 98–111)
Creatinine, Ser: 0.9 mg/dL (ref 0.61–1.24)
GFR calc Af Amer: >60 mL/min (ref >60)
GFR calc non Af Amer: >60 mL/min (ref >60)
Glucose, Bld: 106 mg/dL — ABNORMAL HIGH (ref 70–99)
Potassium: 3.7 mmol/L (ref 3.5–5.1)
Sodium: 136 mmol/L (ref 135–145)

## 2018-12-06 LAB — TROPONIN I: Troponin I: <0.03 ng/mL (ref <0.03)

## 2018-12-06 NOTE — ED Triage Notes (Signed)
Patient c/o chest discomfort and SOB X 2 year. Patient reports severity increased today.

## 2018-12-07 ENCOUNTER — Emergency Department
Admission: EM | Admit: 2018-12-07 | Discharge: 2018-12-07 | Disposition: A | Discharge status: Home / Self Care | Payer: BLUE CROSS/BLUE SHIELD | Attending: Emergency Medicine | Admitting: Emergency Medicine

## 2018-12-07 DIAGNOSIS — R079 Chest pain, unspecified: Secondary | ICD-10-CM

## 2018-12-07 LAB — TSH: TSH: 9.931 u[IU]/mL — ABNORMAL HIGH (ref 0.350–4.500)

## 2018-12-07 LAB — T4, FREE: Free T4: 0.76 ng/dL — ABNORMAL LOW (ref 0.82–1.77)

## 2018-12-07 MED ORDER — KETOROLAC TROMETHAMINE 30 MG/ML IJ SOLN
30.0000 mg | Freq: Once | INTRAMUSCULAR | Status: AC
  Administered 2018-12-07: 30 mg via INTRAMUSCULAR
  Filled 2018-12-07: qty 1

## 2018-12-07 MED ORDER — ALBUTEROL SULFATE (2.5 MG/3ML) 0.083% IN NEBU
2.5000 mg | INHALATION_SOLUTION | Freq: Once | RESPIRATORY_TRACT | Status: AC
  Administered 2018-12-07: 2.5 mg via RESPIRATORY_TRACT
  Filled 2018-12-07: qty 3

## 2018-12-07 NOTE — ED Provider Notes (Signed)
[facility] Emergency Department Provider Note   ____________________________________________   First MD Initiated Contact with Patient 12/07/18 0250     (approximate)  I have reviewed the triage vital signs and the nursing notes.   HISTORY  Chief Complaint Chest Pain  History obtained via Spanish interpreter  HPI Paul Hayes is a 43 y.o. male who presents to the ED from home with a chief complaint of left-sided chest pain.  Patient has had similar pain for the past 2 years.  States he has had dry cough, nasal congestion with increase of pain over the past several days.  Denies associated diaphoresis, palpitations, dizziness, nausea or vomiting.  Last time he was seen in the ED he was started on propranolol for hyperthyroidism but did not follow-up with endocrinology as instructed.  Denies recent travel or trauma.       Past medical history None  There are no active problems to display for this patient.   History reviewed. No pertinent surgical history.  Prior to Admission medications   Medication Sig Start Date End Date Taking? Authorizing Provider  albuterol (PROVENTIL HFA;VENTOLIN HFA) 108 (90 Base) MCG/ACT inhaler Inhale 2 puffs into the lungs every 6 (six) hours as needed for wheezing or shortness of breath. Patient not taking: Reported on 02/05/2018 03/11/16   Minna Antis, MD  azithromycin (ZITHROMAX Z-PAK) 250 MG tablet Take 2 tablets (500 mg) on  Day 1,  followed by 1 tablet (250 mg) once daily on Days 2 through 5. Patient not taking: Reported on 02/05/2018 09/29/15   Menshew, Charlesetta Ivory, PA-C  diazepam (VALIUM) 2 MG tablet Take 1 tablet (2 mg total) by mouth every 8 (eight) hours as needed for muscle spasms. 06/22/18   Irean Hong, MD  ibuprofen (ADVIL,MOTRIN) 600 MG tablet Take 1 tablet (600 mg total) by mouth every 8 (eight) hours as needed. 06/22/18   Irean Hong, MD  predniSONE (DELTASONE) 20 MG tablet Take 2 tablets (40  mg total) by mouth daily. Patient not taking: Reported on 02/05/2018 03/11/16   Minna Antis, MD  predniSONE (STERAPRED UNI-PAK 21 TAB) 10 MG (21) TBPK tablet Take by mouth daily. Dispense steroid taper pack as directed 02/05/18   Emily Filbert, MD  propranolol (INDERAL) 20 MG tablet Take 1 tablet (20 mg total) by mouth 3 (three) times daily. 09/21/18   Irean Hong, MD    Allergies Patient has no known allergies.  No family history on file.  Social History Social History   Tobacco Use  . Smoking status: Former Smoker    Packs/day: 0.10    Types: Cigarettes  . Smokeless tobacco: Never Used  Substance Use Topics  . Alcohol use: Yes  . Drug use: Not Currently    Review of Systems  Constitutional: No fever/chills Eyes: No visual changes. ENT: No sore throat. Cardiovascular: Positive for chest pain. Respiratory: Positive for dry cough and shortness of breath. Gastrointestinal: No abdominal pain.  No nausea, no vomiting.  No diarrhea.  No constipation. Genitourinary: Negative for dysuria. Musculoskeletal: Negative for back pain. Skin: Negative for rash. Neurological: Negative for headaches, focal weakness or numbness.   ____________________________________________   PHYSICAL EXAM:  VITAL SIGNS: ED Triage Vitals  Enc Vitals Group     BP --      Pulse --      Resp --      Temp 12/06/18 1938 98.2 F (36.8 C)     Temp Source 12/06/18 1938 Oral  SpO2 --      Weight 12/06/18 1938 165 lb (74.8 kg)     Height --      Head Circumference --      Peak Flow --      Pain Score 12/06/18 1939 7     Pain Loc --      Pain Edu? --      Excl. in GC? --     Constitutional: Asleep, awakened for exam.  Alert and oriented. Well appearing and in no acute distress. Eyes: Conjunctivae are normal. PERRL. EOMI. Head: Atraumatic. Nose: No congestion/rhinnorhea. Mouth/Throat: Mucous membranes are moist.  Oropharynx non-erythematous. Neck: No stridor.   Cardiovascular:  Normal rate, regular rhythm. Grossly normal heart sounds.  Good peripheral circulation. Respiratory: Normal respiratory effort.  No retractions. Lungs CTAB.  Left anterior chest tender to palpation. Gastrointestinal: Soft and nontender. No distention. No abdominal bruits. No CVA tenderness. Musculoskeletal: No lower extremity tenderness nor edema.  No joint effusions. Neurologic:  Normal speech and language. No gross focal neurologic deficits are appreciated. No gait instability. Skin:  Skin is warm, dry and intact. No rash noted. Psychiatric: Mood and affect are normal. Speech and behavior are normal.  ____________________________________________   LABS (all labs ordered are listed, but only abnormal results are displayed)  Labs Reviewed  BASIC METABOLIC PANEL - Abnormal; Notable for the following components:      Result Value   Glucose, Bld 106 (*)    BUN 22 (*)    All other components within normal limits  TSH - Abnormal; Notable for the following components:   TSH 9.931 (*)    All other components within normal limits  T4, FREE - Abnormal; Notable for the following components:   Free T4 0.76 (*)    All other components within normal limits  CBC  TROPONIN I   ____________________________________________  EKG  ED ECG REPORT I, SUNG,JADE J, the attending physician, personally viewed and interpreted this ECG.   Date: 12/07/2018  EKG Time: 1934  Rate: 71  Rhythm: normal EKG, normal sinus rhythm  Axis: Normal  Intervals:none  ST&T Change: Nonspecific  ____________________________________________  RADIOLOGY  ED MD interpretation: No acute cardiopulmonary process  Official radiology report(s): Dg Chest 2 View  Result Date: 12/06/2018 CLINICAL DATA:  Shortness of breath and chest discomfort EXAM: CHEST - 2 VIEW COMPARISON:  Chest radiograph and chest CT September 21, 2018 FINDINGS: There is no edema or consolidation. The heart size and pulmonary vascularity are normal.  No adenopathy. No bone lesions. No pneumothorax. IMPRESSION: No edema or consolidation. Electronically Signed   By: Bretta Bang III M.D.   On: 12/06/2018 19:54    ____________________________________________   PROCEDURES  Procedure(s) performed (including Critical Care):  Procedures   ____________________________________________   INITIAL IMPRESSION / ASSESSMENT AND PLAN / ED COURSE  As part of my medical decision making, I reviewed the following data within the electronic MEDICAL RECORD NUMBER Nursing notes reviewed and incorporated, Interpreter needed, Labs reviewed, EKG interpreted, Old chart reviewed, Radiograph reviewed and Notes from prior ED visits        43 year old male who presents with a 2-year history of chest pain and shortness of breath. Differential diagnosis includes, but is not limited to, ACS, aortic dissection, pulmonary embolism, cardiac tamponade, pneumothorax, pneumonia, pericarditis, myocarditis, GI-related causes including esophagitis/gastritis, and musculoskeletal chest wall pain.    EKG and troponin are unremarkable.  Low suspicion for ACS given duration of patient's symptoms.  Low suspicion for PE; patient had  negative CT chest in December 2019.  Will administer IM Toradol for chest wall pain.  Will check thyroid panel.   Clinical Course as of Dec 07 547  Sat Dec 07, 2018  5277 Patient soundly asleep.  Updated him of test results.  Strongly encouraged him to follow-up with cardiology.  Strict return precautions given.  Patient verbalizes understanding and agrees with plan of care.   [JS]    Clinical Course User Index [JS] Irean Hong, MD     ____________________________________________   FINAL CLINICAL IMPRESSION(S) / ED DIAGNOSES  Final diagnoses:  Nonspecific chest pain     ED Discharge Orders    None       Note:  This document was prepared using Dragon voice recognition software and may include unintentional dictation errors.    Irean Hong, MD 12/07/18 [phone]

## 2018-12-07 NOTE — ED Notes (Signed)
Pt reports he finished Thyroid medication he was taken but did not get refill, reports has same pain to left side of chest for days denies any injuries denies any falls. Pt talks in complete sentences no distress noted.

## 2018-12-07 NOTE — Discharge Instructions (Addendum)
Please follow-up with the specialist as instructed.  Return to the ER for worsening symptoms, persistent vomiting, difficulty breathing or other concerns.

## 2019-04-16 ENCOUNTER — Telehealth: Payer: Self-pay

## 2019-04-16 NOTE — Telephone Encounter (Signed)
Called and left vm for pt to call back and schedule consult appt. Received faxed referral for pulmonary clearance for surgery.

## 2019-04-18 ENCOUNTER — Telehealth: Payer: Self-pay | Admitting: Internal Medicine

## 2019-04-18 NOTE — Telephone Encounter (Signed)
Called patient for COVID-19 pre-screening for in office visit. ° °Have you recently traveled any where out of the local area in the last 2 weeks? No ° °Have you been in close contact with a person diagnosed with COVID-19 or someone awaiting results within the last 2 weeks? No ° °Do you currently have any of the following symptoms? If so, when did they start? °Cough           Diarrhea   Joint Pain °Fever      Muscle Pain   Red eyes °Shortness of breath   Abdominal pain  Vomiting °Loss of smell    Rash    Sore Throat °Headache    Weakness   Bruising or bleeding ° ° °Okay to proceed with visit 04/21/2019 °  ° ° °

## 2019-04-18 NOTE — Telephone Encounter (Signed)
Appt. Scheduled.Nothing further needed

## 2019-04-20 NOTE — Progress Notes (Signed)
[facility] Consultation      Assessment and Plan:  Emphysema. - Emphysematous changes noted on CT chest, though Hayes function testing 2 years ago showed normal Hayes functions.  His history of smoking is mild. - We will start empirically on inhaler.  Check alpha-1, he would prefer to use it when he returns.   Chronic rhinitis - Patient has severe chronic rhinitis symptoms.  He is using both his Flonase and azelastine nasal spray 8-10 times per day.  And notes symptoms quickly returned when he stops them, often within a few hours. -Discussed importance of only using his medications once to twice per day, and that using them more often can cause damage to the nasal passages. - Discussed that if the standard medications are not helping, that surgery would be a viable option for him.  Risk of Hayes complications are low to moderate.  Date: 04/21/2019  MRN# [id number] Paul Hayes [birth year]  Referring Physician:   [name] is a 43 y.o. old male seen in consultation for chief complaint of:    Chief Complaint  Patient presents with  . Consult    [name]    HPI:  Paul Hayes is a 43 y.o. old male referred for Hayes clearance. He has previously been seen by Dr. Raul Del in 2018 at [facility] for episode of pleurisy. At that time he was noted to have +ve ana, RNP and smith antibodies Therefore he was referred to Rheumatology.  He is referred here today for Hayes clearance for an upcoming septoplasty.  He is wondering if something else can be done besides surgery, he has been tried on claritin, flonase, azelastine with relief while he is using them, but the symptoms come back when he stops using them for just one day. He is using both the nasal sprays about 8-10 times per day. He  He has been seen by ENT and told that he needs surgery.   He feels that his breathing is somewhat short, he feels that he can not get a deep  breath when he is exerting himself. He has had no previous surgery.   He is smoked until about 2 years ago, he was smoking cigs per day only but would smoke a pack per week until the age of 66. No TB exposures known.  He is working in [place], breathing is worse there, he does not wear a mask.   **Chest x-ray 12/06/2018, CT chest 09/21/2018>> imaging personally reviewed.  There are mild emphysematous changes seen in the apices, otherwise lungs are unremarkable. Diagnostics: ANA Direct - [facility] Negative Positive  Anti-DNA (DS) Ab Qn - [facility] 0 - 9 IU/mL <1  Comments:                  Negative   <5                   Equivocal 5 - 9                   Positive   >9  RNP Antibodies - [facility] 0.0 - 0.9 AI 1.0  Smith Antibodies - [facility] 0.0 - 0.9 AI 4.6  Antiscleroderma-70 Antibodies - [facility] 0.0 - 0.9 AI <0.2  Sjogren's Anti-SS-A - [facility] 0.0 - 0.9 AI <0.2  Sjogren's Anti-SS-B - [facility] 0.0 - 0.9 AI <0.2  Antichromatin Antibodies - [facility] 0.0 - 0.9 AI 0.8  Anti-Jo-1 - [facility] 0.0 - 0.9 AI <0.2  Anti-Centromere B Antibodies -  [facility] 0.0 - 0.9 AI <0.2   PFT 12/06/16 @[facility] .  SPIROMETRY: FVC was 4.97 liters, 124% of predicted FEV1 was 3.90, 115% of predicted FEV1 ratio was 78 FEF 25-75% liters per second was 102% of predicted  LUNG VOLUMES: TLC was 125% of predicted RV was 127% of predicted  DIFFUSION CAPACITY: DLCO was 98% of predicted DLCO/VA was 85% of predicted  FLOW VOLUME LOOP: Normal   Impression Spirometry normal ns early obstruction Lung volumes and diffusion capacity is normal      PMHX:   Pleurisy.  Surgical Hx:  None.  Family Hx:  Noncontributory.  Social Hx:   Social History   Tobacco Use  . Smoking status: Former Smoker    Packs/day: 0.10    Types: Cigarettes  . Smokeless tobacco: Never Used  Substance Use Topics  . Alcohol use: Yes  . Drug use: Not Currently   Medication:    Current  Outpatient Medications:  .  albuterol (PROVENTIL HFA;VENTOLIN HFA) 108 (90 Base) MCG/ACT inhaler, Inhale 2 puffs into the lungs every 6 (six) hours as needed for wheezing or shortness of breath. (Patient not taking: Reported on 02/05/2018), Disp: 1 Inhaler, Rfl: 0 .  ibuprofen (ADVIL,MOTRIN) 600 MG tablet, Take 1 tablet (600 mg total) by mouth every 8 (eight) hours as needed., Disp: 15 tablet, Rfl: 0   Allergies:  Patient has no known allergies.  Review of Systems: Gen:  Denies  fever, sweats, chills HEENT: Denies blurred vision, double vision. bleeds, sore throat Cvc:  No dizziness, chest pain. Resp:   Denies cough or sputum production, shortness of breath Gi: Denies swallowing difficulty, stomach pain. Gu:  Denies bladder incontinence, burning urine Ext:   No Joint pain, stiffness. Skin: No skin rash,  hives  Endoc:  No polyuria, polydipsia. Psych: No depression, insomnia. Other:  All other systems were reviewed with the patient and were negative other that what is mentioned in the HPI.   Physical Examination:   VS: BP 110/80 (BP Location: Right Arm, Cuff Size: Normal)   Pulse 80   Temp 98.4 F (36.9 C) (Skin)   Ht 5\' 3"  (1.6 m)   Wt 177 lb 3.2 oz (80.4 kg)   SpO2 98%   BMI 31.39 kg/m   General Appearance: No distress  Neuro:without focal findings,  speech normal,  HEENT: PERRLA, EOM intact.   Hayes: normal breath sounds, No wheezing.  CardiovascularNormal S1,S2.  No m/r/g.   Abdomen: Benign, Soft, non-tender. Renal:  No costovertebral tenderness  GU:  No performed at this time. Endoc: No evident thyromegaly, no signs of acromegaly. Skin:   warm, no rashes, no ecchymosis  Extremities: normal, no cyanosis, clubbing.  Other findings:    LABORATORY PANEL:   CBC No results for input(s): WBC, HGB, HCT, PLT in the last 168 hours. ------------------------------------------------------------------------------------------------------------------  Chemistries  No  results for input(s): NA, K, CL, CO2, GLUCOSE, BUN, CREATININE, CALCIUM, MG, AST, ALT, ALKPHOS, BILITOT in the last 168 hours.  Invalid input(s): GFRCGP ------------------------------------------------------------------------------------------------------------------  Cardiac Enzymes No results for input(s): TROPONINI in the last 168 hours. ------------------------------------------------------------  RADIOLOGY:  No results found.     Thank  you for the consultation and for [facility] to assist in the care of your patient. Our recommendations are noted above.  Please contact us if we can be of further service.   Wells Guileseep Alizae Bechtel, M.D., F.C.C.P.  Board Certified in Internal Medicine, Hayes Medicine, Critical Care Medicine, and Sleep Medicine.  [facility]  [facility] Office Number: [phone]   04/21/2019

## 2019-04-21 ENCOUNTER — Encounter: Payer: Self-pay | Admitting: Internal Medicine

## 2019-04-21 ENCOUNTER — Ambulatory Visit: Payer: BC Managed Care – PPO | Admitting: Internal Medicine

## 2019-04-21 ENCOUNTER — Other Ambulatory Visit: Payer: Self-pay

## 2019-04-21 VITALS — BP 110/80 | HR 80 | Temp 98.4°F | Ht 63.0 in | Wt 177.2 lb

## 2019-04-21 DIAGNOSIS — J310 Chronic rhinitis: Secondary | ICD-10-CM

## 2019-04-21 DIAGNOSIS — J438 Other emphysema: Secondary | ICD-10-CM

## 2019-04-21 NOTE — Patient Instructions (Addendum)
You may proceed with surgery.  Use your inhaler when you have trouble breathing, 2 puffs as often as every 6 hours.  Do not use it more than 3 times in 1 day. You have mild emphysema.  Use nasal sprays only once or twice per day only. Using them more often can cause damage to nasal passages.

## 2019-04-24 ENCOUNTER — Other Ambulatory Visit: Payer: Self-pay | Admitting: Otolaryngology

## 2019-04-24 DIAGNOSIS — J3489 Other specified disorders of nose and nasal sinuses: Secondary | ICD-10-CM

## 2019-04-29 ENCOUNTER — Encounter: Payer: Self-pay | Admitting: *Deleted

## 2019-04-29 ENCOUNTER — Other Ambulatory Visit: Payer: Self-pay

## 2019-04-30 ENCOUNTER — Encounter: Payer: Self-pay | Admitting: Anesthesiology

## 2019-04-30 NOTE — Anesthesia Preprocedure Evaluation (Addendum)
Anesthesia Evaluation  Patient identified by MRN, date of birth, ID band Patient awake    Reviewed: Allergy & Precautions, H&P , NPO status , Patient's Chart, lab work & pertinent test results  Airway Mallampati: II  TM Distance: >3 FB Neck ROM: full    Dental no notable dental hx.    Pulmonary COPD, former smoker,  PFT 12/06/16 @[facility] .  SPIROMETRY: FVC was 4.97 liters, 124% of predicted FEV1 was 3.90, 115% of predicted FEV1 ratio was 78 FEF 25-75% liters per second was 102% of predicted  LUNG VOLUMES: TLC was 125% of predicted RV was 127% of predicted  DIFFUSION CAPACITY: DLCO was 98% of predicted DLCO/VA was 85% of predicted  FLOW VOLUME LOOP: Normal   Impression Spirometry normal ns early obstruction Lung volumes and diffusion capacity is normal   Pulmonary seen 03/2019 for clearance- felt low risk.  Chest xray unremarkable.   Pulmonary exam normal breath sounds clear to auscultation       Cardiovascular Normal cardiovascular exam Rhythm:regular Rate:Normal     Neuro/Psych    GI/Hepatic   Endo/Other    Renal/GU      Musculoskeletal   Abdominal   Peds  Hematology   Anesthesia Other Findings   Reproductive/Obstetrics                            Anesthesia Physical Anesthesia Plan  ASA: II  Anesthesia Plan: General ETT   Post-op Pain Management:    Induction:   PONV Risk Score and Plan: 1 and Treatment may vary due to age or medical condition, Midazolam, Ondansetron and Dexamethasone  Airway Management Planned:   Additional Equipment:   Intra-op Plan:   Post-operative Plan:   Informed Consent: I have reviewed the patients History and Physical, chart, labs and discussed the procedure including the risks, benefits and alternatives for the proposed anesthesia with the patient or authorized representative who has indicated his/her understanding and acceptance.        Plan Discussed with: CRNA  Anesthesia Plan Comments:         Anesthesia Quick Evaluation

## 2019-05-01 NOTE — Discharge Instructions (Signed)
[facility] °[facility] °ENDOSCOPIC SINUS SURGERY °[facility] ° °What is Functional Endoscopic Sinus Surgery? ° The Surgery involves making the natural openings of the sinuses larger by removing the bony partitions that separate the sinuses from the nasal cavity.  The natural sinus lining is preserved as much as possible to allow the sinuses to resume normal function after the surgery.  In some patients nasal polyps (excessively swollen lining of the sinuses) may be removed to relieve obstruction of the sinus openings.  The surgery is performed through the nose using lighted scopes, which eliminates the need for incisions on the face.  A septoplasty is a different procedure which is sometimes performed with sinus surgery.  It involves straightening the boy partition that separates the two sides of your nose.  A crooked or deviated septum may need repair if is obstructing the sinuses or nasal airflow.  Turbinate reduction is also often performed during sinus surgery.  The turbinates are bony proturberances from the side walls of the nose which swell and can obstruct the nose in patients with sinus and allergy problems.  Their size can be surgically reduced to help relieve nasal obstruction. ° °What Can Sinus Surgery Do For Me? ° Sinus surgery can reduce the frequency of sinus infections requiring antibiotic treatment.  This can provide improvement in nasal congestion, post-nasal drainage, facial pressure and nasal obstruction.  Surgery will NOT prevent you from ever having an infection again, so it usually only for patients who get infections 4 or more times yearly requiring antibiotics, or for infections that do not clear with antibiotics.  It will not cure nasal allergies, so patients with allergies may still require medication to treat their allergies after surgery. Surgery may improve headaches related to sinusitis, however, some people will continue to  require medication to control sinus headaches related to allergies.  Surgery will do nothing for other forms of headache (migraine, tension or cluster). ° °What Are the Risks of Endoscopic Sinus Surgery? ° Current techniques allow surgery to be performed safely with little risk, however, there are rare complications that patients should be aware of.  Because the sinuses are located around the eyes, there is risk of eye injury, including blindness, though again, this would be quite rare. This is usually a result of bleeding behind the eye during surgery, which puts the vision oat risk, though there are treatments to protect the vision and prevent permanent disrupted by surgery causing a leak of the spinal fluid that surrounds the brain.  More serious complications would include bleeding inside the brain cavity or damage to the brain.  Again, all of these complications are uncommon, and spinal fluid leaks can be safely managed surgically if they occur.  The most common complication of sinus surgery is bleeding from the nose, which may require packing or cauterization of the nose.  Continued sinus have polyps may experience recurrence of the polyps requiring revision surgery.  Alterations of sense of smell or injury to the tear ducts are also rare complications.  ° °What is the Surgery Like, and what is the Recovery? ° The Surgery usually takes a couple of hours to perform, and is usually performed under a general anesthetic (completely asleep).  Patients are usually discharged home after a couple of hours.  Sometimes during surgery it is necessary to pack the nose to control bleeding, and the packing is left in place for 24 - 48 hours, and removed by your surgeon.    If a septoplasty was performed during the procedure, there is often a splint placed which must be removed after 5-7 days.   °Discomfort: Pain is usually mild to moderate, and can be controlled by prescription pain medication or acetaminophen (Tylenol).   Aspirin, Ibuprofen (Advil, Motrin), or Naprosyn (Aleve) should be avoided, as they can cause increased bleeding.  Most patients feel sinus pressure like they have a bad head cold for several days.  Sleeping with your head elevated can help reduce swelling and facial pressure, as can ice packs over the face.  A humidifier may be helpful to keep the mucous and blood from drying in the nose.  ° °Diet: There are no specific diet restrictions, however, you should generally start with clear liquids and a light diet of bland foods because the anesthetic can cause some nausea.  Advance your diet depending on how your stomach feels.  Taking your pain medication with food will often help reduce stomach upset which pain medications can cause. ° °Nasal Saline Irrigation: It is important to remove blood clots and dried mucous from the nose as it is healing.  This is done by having you irrigate the nose at least 3 - 4 times daily with a salt water solution.  We recommend using NeilMed Sinus Rinse (available at the drug store).  Fill the squeeze bottle with the solution, bend over a sink, and insert the tip of the squeeze bottle into the nose ½ of an inch.  Point the tip of the squeeze bottle towards the inside corner of the eye on the same side your irrigating.  Squeeze the bottle and gently irrigate the nose.  If you bend forward as you do this, most of the fluid will flow back out of the nose, instead of down your throat.   The solution should be warm, near body temperature, when you irrigate.   Each time you irrigate, you should use a full squeeze bottle.  ° °Note that if you are instructed to use Nasal Steroid Sprays at any time after your surgery, irrigate with saline BEFORE using the steroid spray, so you do not wash it all out of the nose. °Another product, Nasal Saline Gel (such as AYR Nasal Saline Gel) can be applied in each nostril 3 - 4 times daily to moisture the nose and reduce scabbing or crusting. ° °Bleeding:   Bloody drainage from the nose can be expected for several days, and patients are instructed to irrigate their nose frequently with salt water to help remove mucous and blood clots.  The drainage may be dark red or brown, though some fresh blood may be seen intermittently, especially after irrigation.  Do not blow you nose, as bleeding may occur. If you must sneeze, keep your mouth open to allow air to escape through your mouth. ° °If heavy bleeding occurs: Irrigate the nose with saline to rinse out clots, then spray the nose 3 - 4 times with Afrin Nasal Decongestant Spray.  The spray will constrict the blood vessels to slow bleeding.  Pinch the lower half of your nose shut to apply pressure, and lay down with your head elevated.  Ice packs over the nose may help as well. If bleeding persists despite these measures, you should notify your doctor.  Do not use the Afrin routinely to control nasal congestion after surgery, as it can result in worsening congestion and may affect healing.  ° ° ° °Activity: Return to work varies among patients. Most patients will be   out of work at least 5 - 7 days to recover.  Patient may return to work after they are off of narcotic pain medication, and feeling well enough to perform the functions of their job.  Patients must avoid heavy lifting (over 10 pounds) or strenuous physical for 2 weeks after surgery, so your employer may need to assign you to light duty, or keep you out of work longer if light duty is not possible.  NOTE: you should not drive, operate dangerous machinery, do any mentally demanding tasks or make any important legal or financial decisions while on narcotic pain medication and recovering from the general anesthetic.  °  °Call Your Doctor Immediately if You Have Any of the Following: °1. Bleeding that you cannot control with the above measures °2. Loss of vision, double vision, bulging of the eye or black eyes. °3. Fever over 101 degrees °4. Neck stiffness with  severe headache, fever, nausea and change in mental state. °You are always encourage to call anytime with concerns, however, please call with requests for pain medication refills during office hours. ° °Office Endoscopy: During follow-up visits your doctor will remove any packing or splints that may have been placed and evaluate and clean your sinuses endoscopically.  Topical anesthetic will be used to make this as comfortable as possible, though you may want to take your pain medication prior to the visit.  How often this will need to be done varies from patient to patient.  After complete recovery from the surgery, you may need follow-up endoscopy from time to time, particularly if there is concern of recurrent infection or nasal polyps. ° ° °General Anesthesia, Adult, Care After °This sheet gives you information about how to care for yourself after your procedure. Your health care provider may also give you more specific instructions. If you have problems or questions, contact your health care provider. °What can I expect after the procedure? °After the procedure, the following side effects are common: °· Pain or discomfort at the IV site. °· Nausea. °· Vomiting. °· Sore throat. °· Trouble concentrating. °· Feeling cold or chills. °· Weak or tired. °· Sleepiness and fatigue. °· Soreness and body aches. These side effects can affect parts of the body that were not involved in surgery. °Follow these instructions at home: ° °For at least 24 hours after the procedure: °· Have a responsible adult stay with you. It is important to have someone help care for you until you are awake and alert. °· Rest as needed. °· Do not: °? Participate in activities in which you could fall or become injured. °? Drive. °? Use heavy machinery. °? Drink alcohol. °? Take sleeping pills or medicines that cause drowsiness. °? Make important decisions or sign legal documents. °? Take care of children on your own. °Eating and  drinking °· Follow any instructions from your health care provider about eating or drinking restrictions. °· When you feel hungry, start by eating small amounts of foods that are soft and easy to digest (bland), such as toast. Gradually return to your regular diet. °· Drink enough fluid to keep your urine pale yellow. °· If you vomit, rehydrate by drinking water, juice, or clear broth. °General instructions °· If you have sleep apnea, surgery and certain medicines can increase your risk for breathing problems. Follow instructions from your health care provider about wearing your sleep device: °? Anytime you are sleeping, including during daytime naps. °? While taking prescription pain medicines, sleeping medicines, or medicines   that make you drowsy. °· Return to your normal activities as told by your health care provider. Ask your health care provider what activities are safe for you. °· Take over-the-counter and prescription medicines only as told by your health care provider. °· If you smoke, do not smoke without supervision. °· Keep all follow-up visits as told by your health care provider. This is important. °Contact a health care provider if: °· You have nausea or vomiting that does not get better with medicine. °· You cannot eat or drink without vomiting. °· You have pain that does not get better with medicine. °· You are unable to pass urine. °· You develop a skin rash. °· You have a fever. °· You have redness around your IV site that gets worse. °Get help right away if: °· You have difficulty breathing. °· You have chest pain. °· You have blood in your urine or stool, or you vomit blood. °Summary °· After the procedure, it is common to have a sore throat or nausea. It is also common to feel tired. °· Have a responsible adult stay with you for the first 24 hours after general anesthesia. It is important to have someone help care for you until you are awake and alert. °· When you feel hungry, start by eating  small amounts of foods that are soft and easy to digest (bland), such as toast. Gradually return to your regular diet. °· Drink enough fluid to keep your urine pale yellow. °· Return to your normal activities as told by your health care provider. Ask your health care provider what activities are safe for you. °This information is not intended to replace advice given to you by your health care provider. Make sure you discuss any questions you have with your health care provider. °Document Released: 12/18/2000 Document Revised: 09/14/2017 Document Reviewed: 04/27/2017 °Elsevier Patient Education © 2020 [employer] ° °

## 2019-05-02 ENCOUNTER — Other Ambulatory Visit
Admission: RE | Admit: 2019-05-02 | Discharge: 2019-05-02 | Disposition: A | Discharge status: Home / Self Care | Payer: BC Managed Care – PPO | Source: Ambulatory Visit | Attending: Otolaryngology | Admitting: Otolaryngology

## 2019-05-02 ENCOUNTER — Other Ambulatory Visit: Payer: Self-pay

## 2019-05-02 DIAGNOSIS — Z01812 Encounter for preprocedural laboratory examination: Secondary | ICD-10-CM | POA: Diagnosis present

## 2019-05-02 DIAGNOSIS — Z20828 Contact with and (suspected) exposure to other viral communicable diseases: Secondary | ICD-10-CM | POA: Diagnosis not present

## 2019-05-03 LAB — SARS CORONAVIRUS 2 (TAT 6-24 HRS): SARS Coronavirus 2: NEGATIVE

## 2019-05-07 ENCOUNTER — Ambulatory Visit: Payer: BC Managed Care – PPO | Admitting: Anesthesiology

## 2019-05-07 ENCOUNTER — Other Ambulatory Visit: Payer: Self-pay

## 2019-05-07 ENCOUNTER — Encounter
Admission: RE | Disposition: A | Discharge status: Home / Self Care | Payer: Self-pay | Source: Home / Self Care | Attending: Otolaryngology

## 2019-05-07 ENCOUNTER — Ambulatory Visit
Admission: RE | Admit: 2019-05-07 | Discharge: 2019-05-07 | Disposition: A | Discharge status: Home / Self Care | Payer: BC Managed Care – PPO | Attending: Otolaryngology | Admitting: Otolaryngology

## 2019-05-07 DIAGNOSIS — J342 Deviated nasal septum: Secondary | ICD-10-CM | POA: Insufficient documentation

## 2019-05-07 DIAGNOSIS — J449 Chronic obstructive pulmonary disease, unspecified: Secondary | ICD-10-CM | POA: Insufficient documentation

## 2019-05-07 DIAGNOSIS — Z79899 Other long term (current) drug therapy: Secondary | ICD-10-CM | POA: Insufficient documentation

## 2019-05-07 DIAGNOSIS — J343 Hypertrophy of nasal turbinates: Secondary | ICD-10-CM | POA: Diagnosis not present

## 2019-05-07 DIAGNOSIS — Z87891 Personal history of nicotine dependence: Secondary | ICD-10-CM | POA: Insufficient documentation

## 2019-05-07 DIAGNOSIS — Z7951 Long term (current) use of inhaled steroids: Secondary | ICD-10-CM | POA: Insufficient documentation

## 2019-05-07 HISTORY — PX: SEPTOPLASTY: SHX2393

## 2019-05-07 HISTORY — PX: ENDOSCOPIC TURBINATE REDUCTION: SHX6489

## 2019-05-07 HISTORY — DX: Emphysema, unspecified: J43.9

## 2019-05-07 SURGERY — SEPTOPLASTY, NOSE
Anesthesia: General

## 2019-05-07 MED ORDER — AMOXICILLIN-POT CLAVULANATE 875-125 MG PO TABS: 1.0000 | ORAL_TABLET | Freq: Two times a day (BID) | ORAL | 0 refills | Status: DC

## 2019-05-07 MED ORDER — ONDANSETRON HCL 4 MG/2ML IJ SOLN: 4.0000 mg | Freq: Once | INTRAMUSCULAR | Status: DC | PRN

## 2019-05-07 MED ORDER — FENTANYL CITRATE (PF) 100 MCG/2ML IJ SOLN
INTRAMUSCULAR | Status: DC | PRN
  Administered 2019-05-07 (×2): 50 ug via INTRAVENOUS

## 2019-05-07 MED ORDER — OXYMETAZOLINE HCL 0.05 % NA SOLN
NASAL | Status: DC | PRN
  Administered 2019-05-07: 1 via TOPICAL

## 2019-05-07 MED ORDER — BACITRACIN 500 UNIT/GM EX OINT
TOPICAL_OINTMENT | CUTANEOUS | Status: DC | PRN
  Administered 2019-05-07: 1 via TOPICAL

## 2019-05-07 MED ORDER — OXYCODONE-ACETAMINOPHEN 5-325 MG PO TABS: 1.0000 | ORAL_TABLET | Freq: Four times a day (QID) | ORAL | 0 refills | Status: AC | PRN

## 2019-05-07 MED ORDER — MIDAZOLAM HCL 5 MG/5ML IJ SOLN
INTRAMUSCULAR | Status: DC | PRN
  Administered 2019-05-07: 2 mg via INTRAVENOUS

## 2019-05-07 MED ORDER — OXYCODONE HCL 5 MG/5ML PO SOLN: 5.0000 mg | Freq: Once | ORAL | Status: AC | PRN

## 2019-05-07 MED ORDER — ONDANSETRON HCL 4 MG PO TABS: 4.0000 mg | ORAL_TABLET | Freq: Three times a day (TID) | ORAL | 0 refills | Status: DC | PRN

## 2019-05-07 MED ORDER — SUCCINYLCHOLINE CHLORIDE 20 MG/ML IJ SOLN
INTRAMUSCULAR | Status: DC | PRN
  Administered 2019-05-07: 100 mg via INTRAVENOUS

## 2019-05-07 MED ORDER — PROPOFOL 10 MG/ML IV BOLUS
INTRAVENOUS | Status: DC | PRN
  Administered 2019-05-07: 150 mg via INTRAVENOUS

## 2019-05-07 MED ORDER — LIDOCAINE-EPINEPHRINE 1 %-1:100000 IJ SOLN
INTRAMUSCULAR | Status: DC | PRN
  Administered 2019-05-07: 8 mL

## 2019-05-07 MED ORDER — LIDOCAINE HCL (CARDIAC) PF 100 MG/5ML IV SOSY
PREFILLED_SYRINGE | INTRAVENOUS | Status: DC | PRN
  Administered 2019-05-07: 30 mg via INTRAVENOUS

## 2019-05-07 MED ORDER — GLYCOPYRROLATE 0.2 MG/ML IJ SOLN
INTRAMUSCULAR | Status: DC | PRN
  Administered 2019-05-07: 0.1 mg via INTRAVENOUS

## 2019-05-07 MED ORDER — SCOPOLAMINE 1 MG/3DAYS TD PT72
1.0000 | MEDICATED_PATCH | Freq: Once | TRANSDERMAL | Status: DC
  Administered 2019-05-07: 1.5 mg via TRANSDERMAL

## 2019-05-07 MED ORDER — OXYCODONE HCL 5 MG PO TABS
5.0000 mg | ORAL_TABLET | Freq: Once | ORAL | Status: AC | PRN
  Administered 2019-05-07: 5 mg via ORAL

## 2019-05-07 MED ORDER — LACTATED RINGERS IV SOLN
INTRAVENOUS | Status: DC
  Administered 2019-05-07: 07:00:00 via INTRAVENOUS

## 2019-05-07 MED ORDER — ACETAMINOPHEN 10 MG/ML IV SOLN: 1000.0000 mg | Freq: Once | INTRAVENOUS | Status: DC

## 2019-05-07 MED ORDER — FENTANYL CITRATE (PF) 100 MCG/2ML IJ SOLN
25.0000 ug | INTRAMUSCULAR | Status: DC | PRN
  Administered 2019-05-07: 25 ug via INTRAVENOUS

## 2019-05-07 MED ORDER — ONDANSETRON HCL 4 MG/2ML IJ SOLN
INTRAMUSCULAR | Status: DC | PRN
  Administered 2019-05-07: 4 mg via INTRAVENOUS

## 2019-05-07 MED ORDER — DEXAMETHASONE SODIUM PHOSPHATE 4 MG/ML IJ SOLN
INTRAMUSCULAR | Status: DC | PRN
  Administered 2019-05-07: 4 mg via INTRAVENOUS

## 2019-05-07 MED ORDER — ACETAMINOPHEN 10 MG/ML IV SOLN
1000.0000 mg | Freq: Once | INTRAVENOUS | Status: AC
  Administered 2019-05-07: 1000 mg via INTRAVENOUS

## 2019-05-07 SURGICAL SUPPLY — 27 items
CANISTER SUCT 1200ML W/VALVE (MISCELLANEOUS) ×4 IMPLANT
COAG SUCT 10F 3.5MM HAND CTRL (MISCELLANEOUS) ×4 IMPLANT
DRESSING NASL FOAM PST OP SINU (MISCELLANEOUS) IMPLANT
DRSG NASAL FOAM POST OP SINU (MISCELLANEOUS) ×4
ELECT REM PT RETURN 9FT ADLT (ELECTROSURGICAL) ×4
ELECTRODE REM PT RTRN 9FT ADLT (ELECTROSURGICAL) ×2 IMPLANT
GLOVE BIO SURGEON STRL SZ7.5 (GLOVE) ×8 IMPLANT
GOWN STRL REUS W/ TWL LRG LVL3 (GOWN DISPOSABLE) ×2 IMPLANT
GOWN STRL REUS W/TWL LRG LVL3 (GOWN DISPOSABLE) ×2
KIT TURNOVER KIT A (KITS) ×4 IMPLANT
NDL HYPO 25GX1X1/2 BEV (NEEDLE) ×2 IMPLANT
NEEDLE HYPO 25GX1X1/2 BEV (NEEDLE) ×4 IMPLANT
NS IRRIG 500ML POUR BTL (IV SOLUTION) ×4 IMPLANT
PACK ENT CUSTOM (PACKS) ×4 IMPLANT
PATTIES SURGICAL .5 X3 (DISPOSABLE) ×4 IMPLANT
SOL ANTI-FOG 6CC FOG-OUT (MISCELLANEOUS) ×2 IMPLANT
SOL FOG-OUT ANTI-FOG 6CC (MISCELLANEOUS) ×2
SPLINT NASAL .50MM LRG (MISCELLANEOUS) ×2 IMPLANT
SPLINT NASAL SEPTAL BLV .25 LG (MISCELLANEOUS) ×4 IMPLANT
STRAP BODY AND KNEE 60X3 (MISCELLANEOUS) ×4 IMPLANT
SUT CHROMIC 4 0 RB 1X27 (SUTURE) ×4 IMPLANT
SUT ETHILON 4-0 (SUTURE)
SUT ETHILON 4-0 FS2 18XMFL BLK (SUTURE)
SUT PLAIN GUT 4-0 (SUTURE) ×4 IMPLANT
SUTURE ETHLN 4-0 FS2 18XMF BLK (SUTURE) IMPLANT
SYR 10ML LL (SYRINGE) ×4 IMPLANT
TOWEL OR 17X26 4PK STRL BLUE (TOWEL DISPOSABLE) ×4 IMPLANT

## 2019-05-07 NOTE — Op Note (Signed)
..05/07/2019  9:30 AM    Paul Hayes  [id number]    Pre-Op Dx:  Deviated Nasal Septum, Hypertrophic Inferior Turbinates  Post-op Dx: Same  Proc: Nasal Septoplasty, Bilateral Partial Reduction Inferior Turbinates   Surg:  Paul Hayes  Anes:  GOT  EBL:  25ml  Comp:  none  Findings: left sided septal spur with impaction onto the left inferior turbinate, bilateral inferior turbinate hypertrophy  Procedure: With the patient in a comfortable supine position,  general orotracheal anesthesia was induced without difficulty.  The patient received preoperative Afrin spray for topical decongestion and vasoconstriction.  At an appropriate level, the patient was placed in a semi-sitting position.  Nasal vibrissae were trimmed.   1% Xylocaine with 1:100,000 epinephrine, 8 cc's, was infiltrated into the anterior floor of the nose, into the nasal spine region, into the membranous columella, and finally into the submucoperichondrial plane of the septum on both sides.  Several minutes were allowed for this to take effect.  Cottoniod pledgetts soaked in Afrin were placed into both nasal cavities and left while the patient was prepped and draped in the standard fashion.   A proper time-out was performed.  The materials were removed from the nose and observed to be intact and correct in number.  The nose was inspected with a headlight and zero degree endoscope with the findings as described above.  A left Killian incision was sharply executed and carried down to the caudal edge of the quadrangular cartilage with a 15 blade scapel.  A mucoperichondrial flap was elelvated along the quadrangular plate back to the bony-cartilaginous junction using caudal elevator and freer elevator. The mucoperiostium was then elevated along the ethmoid plate and the vomer. An itracartilagenous incision was made using the freer elevator and a contralateral mucoperichondiral flap was elevated using a freer  elevator.  Care was taken to avoid any large rents or opposing rents in the mucoperichondrial flap.  Boney spurs of the vomer and maxillary crest were removed with Takahashi forceps.  The area of cartilagenous deviation was removed with combination of freer elevator and Takahashi forceps creating a widely patent nasal cavity as well as resolution of obstruction from the cartilagenous deviation. The mucosal flaps were placed back into their anatomic position to allow visualization of the airways. The septum now sat in the midline with an improved airway.  A 4-0 Chromic was used to close the HenryKillian incision as well.   The inferior turbinates were then inspected.  Under endoscopic visualization, the inferior turbinates were infractured bilaterally with a Therapist, nutritionalreer elevator.  A kelly clamp was attached to the anterior-inferior third of each inferior turbinate for approximately one minute.  Under endoscopic visualization, Tru-cutting forceps were used to remove the anterior-inferior third of each inferior turbinate.  Electrocautery was used to control bleeding in the area. The remaining turbinate was then outfractured to open up the airway further. There was no significant bleeding noted. The right turbinate was then trimmed and outfractured in a similar fashion.  The airways were then visualized and showed open passageways on both sides that were significantly improved compared to before surgery.       There was no signifcant bleeding. Nasal splints were applied to both sides of the septum using Xomed 0.665mm regular sized splints that were trimmed, and then held in position with a 3-0 Nylon through and through suture.  Stamberger sinufoam was placed along the cut edge of the inferior turbinates bilaterally.  The patient was turned back over to anesthesia,  and awakened, extubated, and taken to the PACU in satisfactory condition.  Dispo:   PACU to home  Plan: Ice, elevation, narcotic analgesia, steroid taper,  and prophylactic antibiotics for the duration of indwelling nasal foreign bodies.  We will reevaluate the patient in the office in 6 days and remove the septal splints.  Return to work in 10 days, strenuous activities in two weeks.   Paul Hayes 05/07/2019 9:30 AM

## 2019-05-07 NOTE — H&P (Signed)
..  History and Physical paper copy reviewed and updated date of procedure and will be scanned into system.  Patient seen and examined.  

## 2019-05-07 NOTE — Transfer of Care (Signed)
Immediate Anesthesia Transfer of Care Note  Patient: Paul Hayes  Procedure(s) Performed: SEPTOPLASTY (N/A ) ENDOSCOPIC TURBINATE REDUCTION (Bilateral )  Patient Location: PACU  Anesthesia Type: General ETT  Level of Consciousness: awake, alert  and patient cooperative  Airway and Oxygen Therapy: Patient Spontanous Breathing and Patient connected to supplemental oxygen  Post-op Assessment: Post-op Vital signs reviewed, Patient's Cardiovascular Status Stable, Respiratory Function Stable, Patent Airway and No signs of Nausea or vomiting  Post-op Vital Signs: Reviewed and stable  Complications: No apparent anesthesia complications

## 2019-05-07 NOTE — Anesthesia Procedure Notes (Signed)
Procedure Name: Intubation Date/Time: 05/07/2019 8:25 AM Performed by: Cameron Ali, CRNA Pre-anesthesia Checklist: Patient identified, Emergency Drugs available, Suction available, Patient being monitored and Timeout performed Patient Re-evaluated:Patient Re-evaluated prior to induction Oxygen Delivery Method: Circle system utilized Preoxygenation: Pre-oxygenation with 100% oxygen Induction Type: IV induction Ventilation: Mask ventilation without difficulty Laryngoscope Size: Mac and 4 Grade View: Grade I Tube type: Oral Rae Tube size: 7.5 mm Number of attempts: 1 Placement Confirmation: ETT inserted through vocal cords under direct vision,  positive ETCO2 and breath sounds checked- equal and bilateral Tube secured with: Tape Dental Injury: Teeth and Oropharynx as per pre-operative assessment

## 2019-05-07 NOTE — Anesthesia Postprocedure Evaluation (Signed)
Anesthesia Post Note  Patient: Paul Hayes  Procedure(s) Performed: SEPTOPLASTY (N/A ) ENDOSCOPIC TURBINATE REDUCTION (Bilateral )  Patient location during evaluation: PACU Anesthesia Type: General Level of consciousness: awake and alert and oriented Pain management: satisfactory to patient Vital Signs Assessment: post-procedure vital signs reviewed and stable Respiratory status: spontaneous breathing, nonlabored ventilation and respiratory function stable Cardiovascular status: blood pressure returned to baseline and stable Postop Assessment: Adequate PO intake and No signs of nausea or vomiting Anesthetic complications: no    Raliegh Ip

## 2019-05-08 ENCOUNTER — Encounter: Payer: Self-pay | Admitting: Otolaryngology

## 2019-07-11 ENCOUNTER — Telehealth: Payer: Self-pay

## 2019-07-11 NOTE — Telephone Encounter (Signed)
LMTCB  Please schedule patient a new patient appt with Dr. Darnell Level.

## 2019-07-15 NOTE — Telephone Encounter (Signed)
LMTCB

## 2019-07-23 NOTE — Telephone Encounter (Signed)
LMTCB. Still unable to contact patient. Will send letter and close encounter per protocol.   Nothing further needed at this time.

## 2020-03-17 IMAGING — CR DG CHEST 2V
2 series · 2 of 2 positions shown · non-contrast
Comparison: 03/11/2016

CLINICAL DATA: Intermittent left chest pain

EXAM:
CHEST - 2 VIEW

[chest pa]
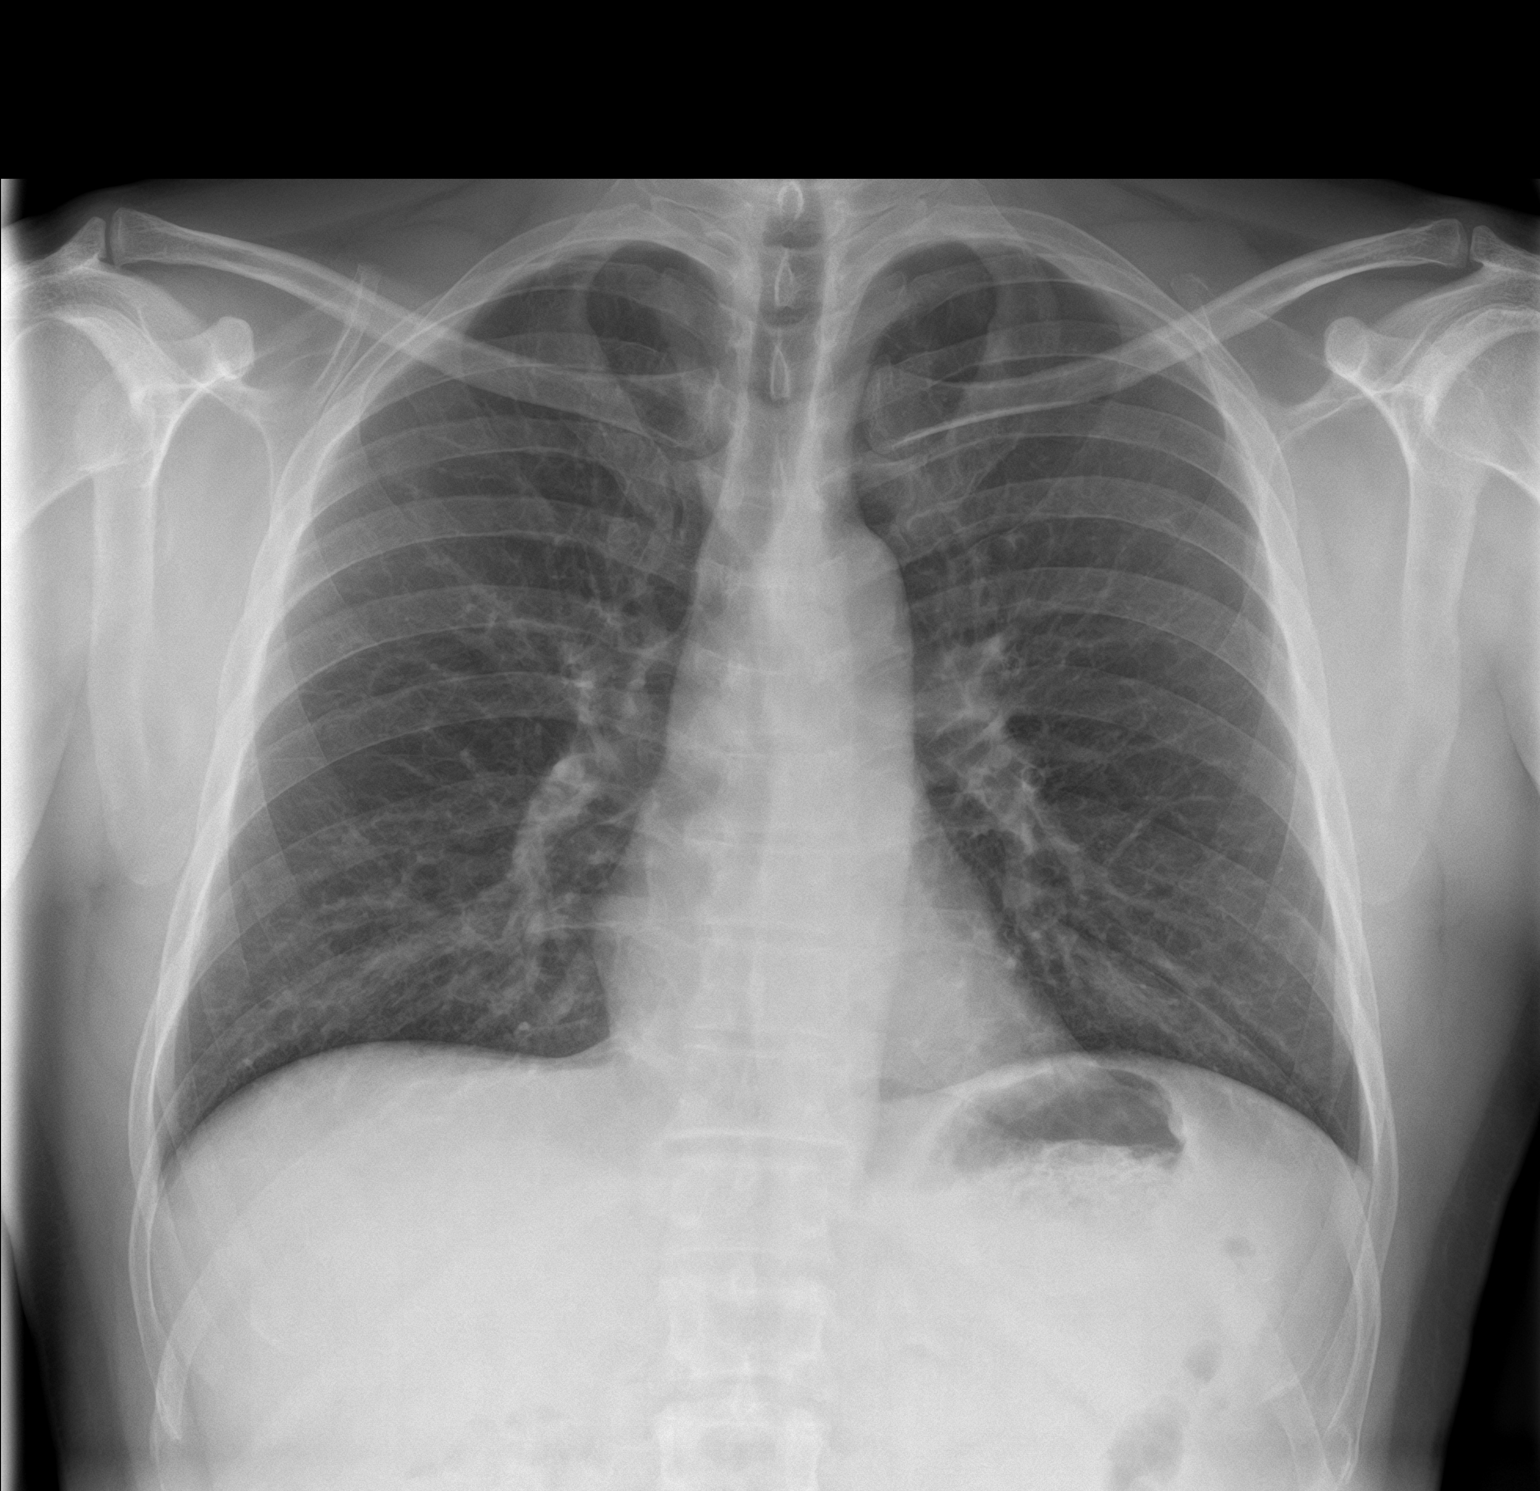

[chest lat]
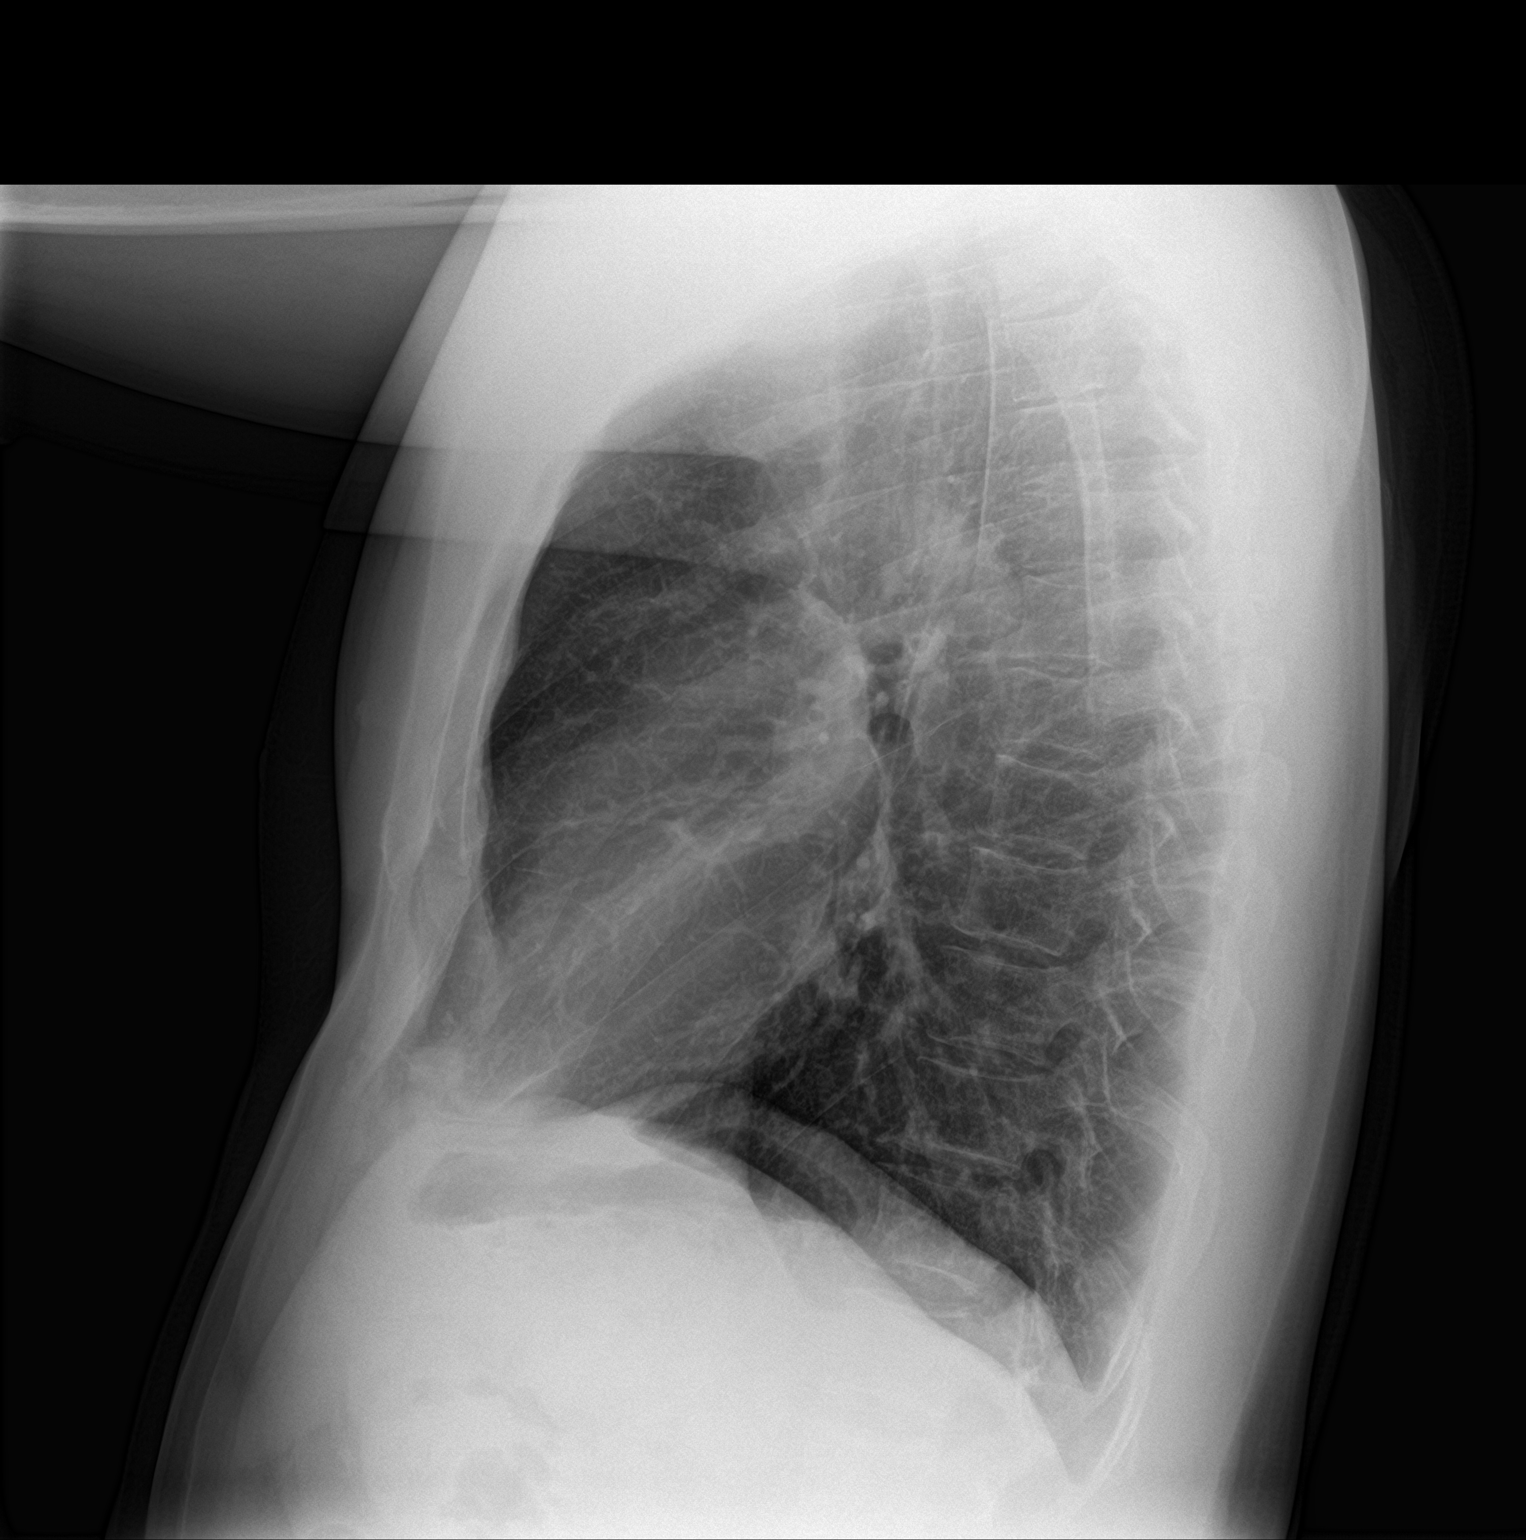

[2 of 2 positions shown; findings below may reference images not displayed]

FINDINGS: Normal heart size and mediastinal contours. There is no edema,
consolidation, effusion, or pneumothorax. No osseous findings.
IMPRESSION: Negative chest.

## 2020-08-08 ENCOUNTER — Other Ambulatory Visit: Payer: Self-pay

## 2020-08-08 ENCOUNTER — Emergency Department: Payer: BC Managed Care – PPO

## 2020-08-08 ENCOUNTER — Emergency Department
Admission: EM | Admit: 2020-08-08 | Discharge: 2020-08-08 | Disposition: A | Discharge status: Home / Self Care | Payer: BC Managed Care – PPO | Attending: Emergency Medicine | Admitting: Emergency Medicine

## 2020-08-08 DIAGNOSIS — Z7951 Long term (current) use of inhaled steroids: Secondary | ICD-10-CM | POA: Diagnosis not present

## 2020-08-08 DIAGNOSIS — Z87891 Personal history of nicotine dependence: Secondary | ICD-10-CM | POA: Insufficient documentation

## 2020-08-08 DIAGNOSIS — K5792 Diverticulitis of intestine, part unspecified, without perforation or abscess without bleeding: Secondary | ICD-10-CM | POA: Diagnosis not present

## 2020-08-08 DIAGNOSIS — R1032 Left lower quadrant pain: Secondary | ICD-10-CM

## 2020-08-08 DIAGNOSIS — R109 Unspecified abdominal pain: Secondary | ICD-10-CM | POA: Diagnosis present

## 2020-08-08 LAB — URINALYSIS, COMPLETE (UACMP) WITH MICROSCOPIC
Bacteria, UA: NONE SEEN
Bilirubin Urine: NEGATIVE
Glucose, UA: NEGATIVE mg/dL
Hgb urine dipstick: NEGATIVE
Ketones, ur: NEGATIVE mg/dL
Leukocytes,Ua: NEGATIVE
Nitrite: NEGATIVE
Protein, ur: NEGATIVE mg/dL
Specific Gravity, Urine: 1.026 (ref 1.005–1.030)
Squamous Epithelial / HPF: NONE SEEN (ref 0–5)
pH: 6 (ref 5.0–8.0)

## 2020-08-08 LAB — COMPREHENSIVE METABOLIC PANEL
ALT: 42 U/L (ref 0–44)
AST: 26 U/L (ref 15–41)
Albumin: 4.4 g/dL (ref 3.5–5.0)
Alkaline Phosphatase: 83 U/L (ref 38–126)
Anion gap: 9 (ref 5–15)
BUN: 19 mg/dL (ref 6–20)
CO2: 27 mmol/L (ref 22–32)
Calcium: 9.3 mg/dL (ref 8.9–10.3)
Chloride: 102 mmol/L (ref 98–111)
Creatinine, Ser: 0.75 mg/dL (ref 0.61–1.24)
GFR, Estimated: >60 mL/min (ref >60)
Glucose, Bld: 121 mg/dL — ABNORMAL HIGH (ref 70–99)
Potassium: 3.8 mmol/L (ref 3.5–5.1)
Sodium: 138 mmol/L (ref 135–145)
Total Bilirubin: 0.9 mg/dL (ref 0.3–1.2)
Total Protein: 8 g/dL (ref 6.5–8.1)

## 2020-08-08 LAB — CBC
HCT: 47.8 % (ref 39.0–52.0)
Hemoglobin: 16.8 g/dL (ref 13.0–17.0)
MCH: 30.7 pg (ref 26.0–34.0)
MCHC: 35.1 g/dL (ref 30.0–36.0)
MCV: 87.4 fL (ref 80.0–100.0)
Platelets: 220 10*3/uL (ref 150–400)
RBC: 5.47 MIL/uL (ref 4.22–5.81)
RDW: 13.1 % (ref 11.5–15.5)
WBC: 9 10*3/uL (ref 4.0–10.5)
nRBC: 0 % (ref 0.0–0.2)

## 2020-08-08 LAB — LIPASE, BLOOD: Lipase: 37 U/L (ref 11–51)

## 2020-08-08 MED ORDER — METRONIDAZOLE 500 MG PO TABS: 500.0000 mg | ORAL_TABLET | Freq: Three times a day (TID) | ORAL | 0 refills | Status: AC

## 2020-08-08 MED ORDER — IOHEXOL 300 MG/ML  SOLN
100.0000 mL | Freq: Once | INTRAMUSCULAR | Status: AC | PRN
  Administered 2020-08-08: 100 mL via INTRAVENOUS

## 2020-08-08 MED ORDER — IOHEXOL 350 MG/ML SOLN: 100.0000 mL | Freq: Once | INTRAVENOUS | Status: DC | PRN

## 2020-08-08 MED ORDER — CIPROFLOXACIN HCL 500 MG PO TABS: 500.0000 mg | ORAL_TABLET | Freq: Two times a day (BID) | ORAL | 0 refills | Status: AC

## 2020-08-08 NOTE — ED Notes (Signed)
Pt resting in bed, given remote for TV. NAD noted at this time.

## 2020-08-08 NOTE — ED Provider Notes (Signed)
[facility] Emergency Department Provider Note   ____________________________________________   First MD Initiated Contact with Patient 08/08/20 1333     (approximate)  I have reviewed the triage vital signs and the nursing notes.   HISTORY  Chief Complaint Abdominal Pain    HPI Paul Hayes is a 44 y.o. male with a stated past medical history of deviated septum status post septoplasty who presents for new left sided abdominal pain.  Patient states that over the last 2 weeks he has had intermittent left-sided abdominal pain that has become more frequent since onset.  Patient states that pressing on this area actually improved symptoms.  Patient states when he does not press on it he has symptoms of a burning, left upper and lower quadrant pain that does not radiate and is associated with an intermittently palpable abdominal mass.  Patient is concerned that he may have a hernia but does not recall any previous diagnosis of hernia or any recent abdominal trauma.  Patient denies any nausea/vomiting/diarrhea/constipation         Past Medical History:  Diagnosis Date  . Emphysema lung (HCC)     There are no problems to display for this patient.   Past Surgical History:  Procedure Laterality Date  . ENDOSCOPIC TURBINATE REDUCTION Bilateral 05/07/2019   Procedure: ENDOSCOPIC TURBINATE REDUCTION;  Surgeon: Bud Face, MD;  Location: [facility];  Service: ENT;  Laterality: Bilateral;  . NO PAST SURGERIES    . SEPTOPLASTY N/A 05/07/2019   Procedure: SEPTOPLASTY;  Surgeon: Bud Face, MD;  Location: [facility];  Service: ENT;  Laterality: N/A;    Prior to Admission medications   Medication Sig Start Date End Date Taking? Authorizing Provider  albuterol (PROVENTIL HFA;VENTOLIN HFA) 108 (90 Base) MCG/ACT inhaler Inhale 2 puffs into the lungs every 6 (six) hours as needed for wheezing or shortness of breath. Patient not  taking: Reported on 04/30/2019 03/11/16   Minna Antis, MD  amoxicillin-clavulanate (AUGMENTIN) 875-125 MG tablet Take 1 tablet by mouth 2 (two) times daily. 05/07/19   Vaught, Roney Mans, MD  azelastine (ASTELIN) 0.1 % nasal spray Place into both nostrils 2 (two) times daily. Use in each nostril as directed    [provider]  ciprofloxacin (CIPRO) 500 MG tablet Take 1 tablet (500 mg total) by mouth 2 (two) times daily for 7 days. 08/08/20 08/15/20  Merwyn Katos, MD  fluticasone (FLONASE) 50 MCG/ACT nasal spray Place into both nostrils daily.    [provider]  fluticasone (FLOVENT DISKUS) 50 MCG/BLIST diskus inhaler Inhale 1 puff into the lungs 2 (two) times daily.    [provider]  loratadine (CLARITIN) 10 MG tablet Take 10 mg by mouth daily.    [provider]  metroNIDAZOLE (FLAGYL) 500 MG tablet Take 1 tablet (500 mg total) by mouth 3 (three) times daily for 7 days. 08/08/20 08/15/20  Merwyn Katos, MD  ondansetron (ZOFRAN) 4 MG tablet Take 1 tablet (4 mg total) by mouth every 8 (eight) hours as needed for up to 10 doses for nausea or vomiting. 05/07/19   Bud Face, MD    Allergies Patient has no known allergies.  History reviewed. No pertinent family history.  Social History Social History   Tobacco Use  . Smoking status: Former Smoker    Packs/day: 0.10    Types: Cigarettes    Quit date: 12/2017    Years since quitting: 2.6  . Smokeless tobacco: Never Used  Vaping Use  .  Vaping Use: Never used  Substance Use Topics  . Alcohol use: Yes    Alcohol/week: 2.0 standard drinks    Types: 2 Cans of beer per week  . Drug use: Not Currently    Review of Systems Constitutional: No fever/chills Eyes: No visual changes. ENT: No sore throat. Cardiovascular: Denies chest pain. Respiratory: Denies shortness of breath. Gastrointestinal: Endorses abdominal pain.  No nausea, no vomiting.  No diarrhea. Genitourinary: Negative for  dysuria. Musculoskeletal: Negative for acute arthralgias Skin: Negative for rash. Neurological: Negative for headaches, weakness/numbness/paresthesias in any extremity Psychiatric: Negative for suicidal ideation/homicidal ideation   ____________________________________________   PHYSICAL EXAM:  VITAL SIGNS: ED Triage Vitals  Enc Vitals Group     BP 08/08/20 1241 129/80     Pulse Rate 08/08/20 1241 78     Resp 08/08/20 1241 14     Temp 08/08/20 1241 98.6 F (37 C)     Temp Source 08/08/20 1241 Oral     SpO2 08/08/20 1241 98 %     Weight 08/08/20 1242 180 lb (81.6 kg)     Height --      Head Circumference --      Peak Flow --      Pain Score 08/08/20 1241 3     Pain Loc --      Pain Edu? --      Excl. in GC? --    Constitutional: Alert and oriented. Well appearing and in no acute distress. Eyes: Conjunctivae are normal. PERRL. Head: Atraumatic. Nose: No congestion/rhinnorhea. Mouth/Throat: Mucous membranes are moist. Neck: No stridor Cardiovascular: Grossly normal heart sounds.  Good peripheral circulation. Respiratory: Normal respiratory effort.  No retractions. Gastrointestinal: Soft and nontender. No distention.  No palpable masses or abdominal wall defects Musculoskeletal: No obvious deformities Neurologic:  Normal speech and language. No gross focal neurologic deficits are appreciated. Skin:  Skin is warm and dry. No rash noted. Psychiatric: Mood and affect are normal. Speech and behavior are normal.  ____________________________________________   LABS (all labs ordered are listed, but only abnormal results are displayed)  Labs Reviewed  COMPREHENSIVE METABOLIC PANEL - Abnormal; Notable for the following components:      Result Value   Glucose, Bld 121 (*)    All other components within normal limits  URINALYSIS, COMPLETE (UACMP) WITH MICROSCOPIC - Abnormal; Notable for the following components:   Color, Urine YELLOW (*)    APPearance HAZY (*)    All other  components within normal limits  LIPASE, BLOOD  CBC    RADIOLOGY  ED MD interpretation: CT of the abdomen and pelvis with IV contrast shows proximal descending and proximal sigmoid colon diverticulitis without evidence of perforation, free air, or free fluid  Official radiology report(s): CT Abdomen Pelvis W Contrast  Result Date: 08/08/2020 CLINICAL DATA:  Left upper quadrant abdominal pain, left-sided pain for 3 weeks EXAM: CT ABDOMEN AND PELVIS WITH CONTRAST TECHNIQUE: Multidetector CT imaging of the abdomen and pelvis was performed using the standard protocol following bolus administration of intravenous contrast. CONTRAST:  OMNIPAQUE IOHEXOL 300 MG/ML  SOLN COMPARISON:  None. FINDINGS: Lower chest: No acute pleural or parenchymal lung disease. Hepatobiliary: Diffuse hepatic steatosis. No focal liver abnormality. The gallbladder is unremarkable. Pancreas: Unremarkable. No pancreatic ductal dilatation or surrounding inflammatory changes. Spleen: Normal in size without focal abnormality. Adrenals/Urinary Tract: Adrenal glands are unremarkable. Kidneys are normal, without renal calculi, focal lesion, or hydronephrosis. Bladder is unremarkable. Stomach/Bowel: No bowel obstruction or ileus. Normal appendix right lower  quadrant. Diffuse diverticulosis of the distal colon. Areas of segmental wall thickening in the proximal descending colon and proximal sigmoid colon, with minimal pericolonic fat stranding which could reflect mild acute diverticulitis. No perforation, fluid collection, or abscess. Vascular/Lymphatic: No significant vascular findings are present. No enlarged abdominal or pelvic lymph nodes. Reproductive: Prostate is unremarkable. Other: No free fluid or free gas.  No abdominal wall hernia. Musculoskeletal: No acute or destructive bony lesions. Reconstructed images demonstrate no additional findings. IMPRESSION: 1. Mild segmental wall thickening and minimal fat stranding involving the  proximal descending and proximal sigmoid colon, consistent with mild acute uncomplicated diverticulitis. 2. Hepatic steatosis. Electronically Signed   By: Sharlet Salina M.D.   On: 08/08/2020 14:58    ____________________________________________   PROCEDURES  Procedure(s) performed (including Critical Care):  Procedures   ____________________________________________   INITIAL IMPRESSION / ASSESSMENT AND PLAN / ED COURSE  As part of my medical decision making, I reviewed the following data within the electronic MEDICAL RECORD NUMBER Nursing notes reviewed and incorporated, Labs reviewed, Old chart reviewed, Radiograph reviewed and Notes from prior ED visits reviewed and incorporated      Patient has diverticulitis that is amenable to oral antibiotics. Patient has no peritoneal signs or signs of perforation. Patients symptoms not typical for other emergent causes of abdominal pain such as, but not limited to, appendicitis, abdominal aortic aneurysm, surgical biliary disease, acute coronary syndrome, etc.  Patient will be discharged with strict return precautions and follow up with primary MD within 12-24 hours for further evaluation.  Patient understands that they may require admission and IV antibiotics and possibly surgery if they do not improve with oral antibiotics.      ____________________________________________   FINAL CLINICAL IMPRESSION(S) / ED DIAGNOSES  Final diagnoses:  Left lower quadrant abdominal pain  Diverticulitis     ED Discharge Orders         Ordered    ciprofloxacin (CIPRO) 500 MG tablet  2 times daily        08/08/20 1509    metroNIDAZOLE (FLAGYL) 500 MG tablet  3 times daily        08/08/20 1509           Note:  This document was prepared using Dragon voice recognition software and may include unintentional dictation errors.   Merwyn Katos, MD 08/08/20 [phone]

## 2020-08-08 NOTE — ED Notes (Signed)
Pt returned from CT at this time.  

## 2020-08-08 NOTE — ED Triage Notes (Signed)
Pt presents via POV c/o left sided abd pain x3 weeks. Reports pain worse with eating. Pt denies N/V/D. Reports sometimes feels a "ball" in that area.

## 2020-08-08 NOTE — ED Notes (Addendum)
Pt presents to ED via POV with c/o L sided abdominal pain x several days. Pt describes pain as burning. Pt states concern for possible hernia at this time. No apparent worsening with palpation.   Pt also states concerns regarding surgery to nose, reports he was told there is a flap that "opens and closes" and that his did not open/close correctly. Pt states feels like he cannot breathe correctly through his nose. PT A&O x4 at this time.   Marily Memos, VIR used for assessment.

## 2021-10-12 ENCOUNTER — Other Ambulatory Visit: Payer: Self-pay

## 2021-10-12 ENCOUNTER — Emergency Department: Payer: BC Managed Care – PPO

## 2021-10-12 ENCOUNTER — Emergency Department
Admission: EM | Admit: 2021-10-12 | Discharge: 2021-10-13 | Disposition: A | Discharge status: Home / Self Care | Payer: BC Managed Care – PPO | Attending: Emergency Medicine | Admitting: Emergency Medicine

## 2021-10-12 DIAGNOSIS — R079 Chest pain, unspecified: Secondary | ICD-10-CM | POA: Diagnosis not present

## 2021-10-12 DIAGNOSIS — R42 Dizziness and giddiness: Secondary | ICD-10-CM | POA: Insufficient documentation

## 2021-10-12 LAB — CBC
HCT: 42.7 % (ref 39.0–52.0)
Hemoglobin: 14.6 g/dL (ref 13.0–17.0)
MCH: 30.4 pg (ref 26.0–34.0)
MCHC: 34.2 g/dL (ref 30.0–36.0)
MCV: 89 fL (ref 80.0–100.0)
Platelets: 220 10*3/uL (ref 150–400)
RBC: 4.8 MIL/uL (ref 4.22–5.81)
RDW: 13.1 % (ref 11.5–15.5)
WBC: 9.2 10*3/uL (ref 4.0–10.5)
nRBC: 0 % (ref 0.0–0.2)

## 2021-10-12 LAB — BASIC METABOLIC PANEL
Anion gap: 7 (ref 5–15)
BUN: 19 mg/dL (ref 6–20)
CO2: 23 mmol/L (ref 22–32)
Calcium: 9 mg/dL (ref 8.9–10.3)
Chloride: 106 mmol/L (ref 98–111)
Creatinine, Ser: 0.93 mg/dL (ref 0.61–1.24)
GFR, Estimated: >60 mL/min (ref >60)
Glucose, Bld: 96 mg/dL (ref 70–99)
Potassium: 3.4 mmol/L — ABNORMAL LOW (ref 3.5–5.1)
Sodium: 136 mmol/L (ref 135–145)

## 2021-10-12 LAB — TROPONIN I (HIGH SENSITIVITY)
Troponin I (High Sensitivity): 3 ng/L (ref <18)
Troponin I (High Sensitivity): <2 ng/L (ref <18)

## 2021-10-12 NOTE — ED Triage Notes (Signed)
Pt presents via POV c/o left sided chest pain. Pt reports has chest pain, headache, and dizziness. Reports symptoms started x1 week. Reports worsening yesterday.

## 2021-10-13 NOTE — ED Provider Notes (Signed)
[facility] Provider Note    Event Date/Time   First MD Initiated Contact with Patient 10/12/21 2355     (approximate)   History   Chest Pain   The patient and/or family speak(s) Spanish.  They understand they have the right to the use of a hospital interpreter, however at this time they prefer to speak directly with me in Spanish.  They know that they can ask for an interpreter at any time.    HPI  Paul Hayes is a 46 y.o. male with no specific chronic medical issues other than occasional cigarette use and prior septoplasty although he has had diverticulitis in the past and multiple ED visits over the years for dizziness and associated concerns.  He presents tonight for evaluation of about a week of left-sided throbbing or pulsating chest pain.  He said that it seems to get worse when he is tired or if he is working.  Better but does not go away at rest.  No shortness of breath.  Denies fever.  Denies syncope although he reports some history of dizziness intermittently.  Nothing in particular seems to make the symptoms better and he has tried ibuprofen and Tylenol.  He said that he has had some chest pain in the past and he took ibuprofen and it went away but it has not helped over the course of this last week.  He does not have a regular doctor nor a cardiologist.     Physical Exam   Triage Vital Signs: ED Triage Vitals  Enc Vitals Group     BP 10/12/21 2011 132/81     Pulse Rate 10/12/21 2011 78     Resp 10/12/21 2011 20     Temp 10/12/21 2011 99.2 F (37.3 C)     Temp Source 10/12/21 2011 Oral     SpO2 10/12/21 2011 96 %     Weight 10/12/21 2011 77.1 kg (170 lb)     Height 10/12/21 2011 1.626 m (5\' 4" )     Head Circumference --      Peak Flow --      Pain Score 10/12/21 2035 2     Pain Loc --      Pain Edu? --      Excl. in GC? --     Most recent vital signs: Vitals:   10/13/21 0000 10/13/21 0100  BP: 123/77 124/79  Pulse: 68  82  Resp: 20 19  Temp:    SpO2: 100% 100%     General: Awake, no distress.  CV:  Good peripheral perfusion.  No murmurs, normal heart sounds.  No tenderness to palpation of the chest wall. Resp:  Normal effort.  Lungs are clear to auscultation with no wheezing, rales, nor rhonchi. Abd:  No distention.  No tenderness to palpation.  No abdominal bruit. Other:  No focal neurological deficits appreciated.   ED Results / Procedures / Treatments   Labs (all labs ordered are listed, but only abnormal results are displayed) Labs Reviewed  BASIC METABOLIC PANEL - Abnormal; Notable for the following components:      Result Value   Potassium 3.4 (*)    All other components within normal limits  CBC  TROPONIN I (HIGH SENSITIVITY)  TROPONIN I (HIGH SENSITIVITY)     EKG  ED ECG REPORT I, 10/15/21, the attending physician, personally viewed and interpreted this ECG.  Date: 10/12/2021 EKG Time: 20: 15 Rate: 75 Rhythm: normal sinus rhythm QRS  Axis: normal Intervals: normal ST/T Wave abnormalities: Inverted T waves in lead III and aVF, otherwise unremarkable. Narrative Interpretation: no definitive evidence of acute ischemia; does not meet STEMI criteria.    RADIOLOGY I personally reviewed the patient's chest x-ray and see no sign of acute abnormality including but not limited to pneumonia or pneumothorax.  I also reviewed the radiology report and they also identified no acute abnormality.    PROCEDURES:  Critical Care performed: No  .1-3 Lead EKG Interpretation Performed by: Loleta Rose, MD Authorized by: Loleta Rose, MD     Interpretation: normal     ECG rate:  65   ECG rate assessment: normal     Rhythm: sinus rhythm     Ectopy: none     Conduction: normal     MEDICATIONS ORDERED IN ED: Medications - No data to display   IMPRESSION / MDM / ASSESSMENT AND PLAN / ED COURSE  I reviewed the triage vital signs and the nursing notes.         HEART Score: 2                      Differential diagnosis includes, but is not limited to, costochondritis, musculoskeletal pain, angina, ACS, pneumonia, pneumothorax, PE.   The patient is on the cardiac monitor to evaluate for evidence of arrhythmia and/or significant heart rate changes.  Patient is well-appearing and in no distress, texting on his phone with no apparent issues at the time.  He reports some ongoing pulsating left-sided aching chest pain but it has been going on for a week.  Vital signs are stable and within normal limits.  No viral or infectious symptoms.  Normal chest x-ray as described above.    I personally reviewed the patient's EKG. he has some nonspecific changes that could represent ischemia in leads III and aVF.  However I went back over multiple prior EKGs and they are variable.  Occasionally he has inverted T waves as seen tonight, but at other times in the past they have been normal.  This is unlikely to be indicative of acute ischemia particularly given that it has been going on for a week.  Labs ordered tonight include high-sensitivity troponin x2, basic metabolic panel, and CBC.  I personally reviewed the results and his high-sensitivity troponins are within normal limits.  CBC is normal including no leukocytosis and no anemia.  Basic metabolic panel is within normal limits other than a very slightly decreased potassium but he has otherwise normal electrolytes and renal function.  I considered hospital admission for his chest pain, but at this point I believe he is low risk based on his HEART score, PERC negative, and symptoms not suggestive of ACS.  I updated him about his results and he is comfortable with the plan for discharge and outpatient follow-up.  I gave my usual and customary return precautions and provided information about how he can follow-up to establish a primary care doctor as well as a cardiologist.      FINAL CLINICAL IMPRESSION(S) / ED DIAGNOSES   Final  diagnoses:  Chest pain, unspecified type     Rx / DC Orders   ED Discharge Orders     None        Note:  This document was prepared using Dragon voice recognition software and may include unintentional dictation errors.   Loleta Rose, MD 10/13/21 0104

## 2021-11-05 ENCOUNTER — Emergency Department
Admission: EM | Admit: 2021-11-05 | Discharge: 2021-11-08 | Disposition: A | Discharge status: Home / Self Care | Payer: BC Managed Care – PPO | Source: Home / Self Care

## 2021-11-05 ENCOUNTER — Other Ambulatory Visit: Payer: Self-pay

## 2021-11-05 ENCOUNTER — Encounter: Payer: Self-pay | Admitting: Intensive Care

## 2021-11-05 ENCOUNTER — Emergency Department
Admission: EM | Admit: 2021-11-05 | Discharge: 2021-11-05 | Disposition: A | Discharge status: Home / Self Care | Payer: BC Managed Care – PPO | Attending: Emergency Medicine | Admitting: Emergency Medicine

## 2021-11-05 DIAGNOSIS — Z5321 Procedure and treatment not carried out due to patient leaving prior to being seen by health care provider: Secondary | ICD-10-CM | POA: Insufficient documentation

## 2021-11-05 DIAGNOSIS — K6289 Other specified diseases of anus and rectum: Secondary | ICD-10-CM

## 2021-11-05 DIAGNOSIS — M545 Low back pain, unspecified: Secondary | ICD-10-CM | POA: Insufficient documentation

## 2021-11-05 MED ORDER — POLYETHYLENE GLYCOL 3350 17 G PO PACK: 17.0000 g | PACK | Freq: Every day | ORAL | 0 refills | Status: DC

## 2021-11-05 MED ORDER — PHENYLEPHRINE-MINERAL OIL-PET 0.25-14-74.9 % RE OINT: 1.0000 "application " | TOPICAL_OINTMENT | Freq: Two times a day (BID) | RECTAL | 0 refills | Status: DC | PRN

## 2021-11-05 NOTE — ED Provider Notes (Signed)
[facility] Provider Note    Event Date/Time   First MD Initiated Contact with Patient 11/05/21 1014     (approximate)   History   No chief complaint on file.   HPI  Paul Hayes is a 46 y.o. male with past medical history of emphysema presents with rectal pain.  Patient notes that he has had pain in the rectal area for about a year.  He sits a lot at work and pain is worse with sitting.  He also feels itching and occasionally has bleeding after itching.  Never been diagnosed with hemorrhoids.  Has been putting alcohol and antibiotic ointment in the area.  No significant change that brought him in today.  Denies constipation.  No fevers.  No tenesmus.    Past Medical History:  Diagnosis Date   Emphysema lung (HCC)     There are no problems to display for this patient.    Physical Exam  Triage Vital Signs: ED Triage Vitals  Enc Vitals Group     BP      Pulse      Resp      Temp      Temp src      SpO2      Weight      Height      Head Circumference      Peak Flow      Pain Score      Pain Loc      Pain Edu?      Excl. in GC?     Most recent vital signs: There were no vitals filed for this visit.   General: Awake, no distress.  CV:  Good peripheral perfusion.  Resp:  Normal effort.  Abd:  No distention.  Neuro:             Awake, Alert, Oriented x 3  Other:  Normal internal and external rectal exam, no tenderness or fluctuance on internal rectal exam   ED Results / Procedures / Treatments  Labs (all labs ordered are listed, but only abnormal results are displayed) Labs Reviewed - No data to display   EKG     RADIOLOGY    PROCEDURES:  Critical Care performed: No    MEDICATIONS ORDERED IN ED: Medications - No data to display   IMPRESSION / MDM / ASSESSMENT AND PLAN / ED COURSE  I reviewed the triage vital signs and the nursing notes.                              Differential diagnosis includes,  but is not limited to, hemorrhoids, anal fissure, pruritus ani  Patient is a 46 year old male presents with 1 year of rectal pain.  Pain is worse after sitting and he does have pruritus with some mild bleeding only after itching.  No tenesmus fevers chills or constipation.  Patient shows me a photo which does look potentially like external hemorrhoids but on his exam today I do not appreciate any external/internal hemorrhoids no red flags including fluctuance or tenderness on internal rectal exam.  Overall his symptomatology is consistent with hemorrhoids we will treat as such.  Prescribed Preparation H and a stool softener.      FINAL CLINICAL IMPRESSION(S) / ED DIAGNOSES   Final diagnoses:  Rectal pain     Rx / DC Orders   ED Discharge Orders  Ordered    polyethylene glycol (MIRALAX) 17 g packet  Daily        11/05/21 1018    phenylephrine-shark liver oil-mineral oil-petrolatum (PREPARATION H) 0.25-14-74.9 % rectal ointment  2 times daily PRN        11/05/21 1018             Note:  This document was prepared using Dragon voice recognition software and may include unintentional dictation errors.   Georga Hacking, MD 11/05/21 1021

## 2021-11-05 NOTE — ED Triage Notes (Signed)
Pt presents with lower back pain. 

## 2022-04-21 ENCOUNTER — Emergency Department: Payer: BC Managed Care – PPO

## 2022-04-21 ENCOUNTER — Emergency Department
Admission: EM | Admit: 2022-04-21 | Discharge: 2022-04-21 | Disposition: A | Discharge status: Home / Self Care | Payer: BC Managed Care – PPO | Attending: Emergency Medicine | Admitting: Emergency Medicine

## 2022-04-21 ENCOUNTER — Other Ambulatory Visit: Payer: Self-pay

## 2022-04-21 DIAGNOSIS — R0602 Shortness of breath: Secondary | ICD-10-CM | POA: Diagnosis present

## 2022-04-21 DIAGNOSIS — F17210 Nicotine dependence, cigarettes, uncomplicated: Secondary | ICD-10-CM | POA: Insufficient documentation

## 2022-04-21 DIAGNOSIS — J439 Emphysema, unspecified: Secondary | ICD-10-CM | POA: Diagnosis not present

## 2022-04-21 DIAGNOSIS — R791 Abnormal coagulation profile: Secondary | ICD-10-CM | POA: Insufficient documentation

## 2022-04-21 LAB — BASIC METABOLIC PANEL
Anion gap: 6 (ref 5–15)
BUN: 19 mg/dL (ref 6–20)
CO2: 23 mmol/L (ref 22–32)
Calcium: 9.5 mg/dL (ref 8.9–10.3)
Chloride: 110 mmol/L (ref 98–111)
Creatinine, Ser: 0.81 mg/dL (ref 0.61–1.24)
GFR, Estimated: >60 mL/min (ref >60)
Glucose, Bld: 104 mg/dL — ABNORMAL HIGH (ref 70–99)
Potassium: 3.5 mmol/L (ref 3.5–5.1)
Sodium: 139 mmol/L (ref 135–145)

## 2022-04-21 LAB — TROPONIN I (HIGH SENSITIVITY)
Troponin I (High Sensitivity): 5 ng/L (ref <18)
Troponin I (High Sensitivity): 4 ng/L (ref <18)

## 2022-04-21 LAB — CBC
HCT: 45 % (ref 39.0–52.0)
Hemoglobin: 15.5 g/dL (ref 13.0–17.0)
MCH: 30 pg (ref 26.0–34.0)
MCHC: 34.4 g/dL (ref 30.0–36.0)
MCV: 87.2 fL (ref 80.0–100.0)
Platelets: 225 10*3/uL (ref 150–400)
RBC: 5.16 MIL/uL (ref 4.22–5.81)
RDW: 12.9 % (ref 11.5–15.5)
WBC: 10.9 10*3/uL — ABNORMAL HIGH (ref 4.0–10.5)
nRBC: 0 % (ref 0.0–0.2)

## 2022-04-21 LAB — D-DIMER, QUANTITATIVE: D-Dimer, Quant: 0.51 ug/mL-FEU — ABNORMAL HIGH (ref 0.00–0.50)

## 2022-04-21 MED ORDER — IPRATROPIUM-ALBUTEROL 0.5-2.5 (3) MG/3ML IN SOLN
3.0000 mL | Freq: Once | RESPIRATORY_TRACT | Status: AC
  Administered 2022-04-21: 3 mL via RESPIRATORY_TRACT
  Filled 2022-04-21: qty 3

## 2022-04-21 MED ORDER — FAMOTIDINE IN NACL 20-0.9 MG/50ML-% IV SOLN
20.0000 mg | Freq: Once | INTRAVENOUS | Status: AC
  Administered 2022-04-21: 20 mg via INTRAVENOUS
  Filled 2022-04-21: qty 50

## 2022-04-21 MED ORDER — IOHEXOL 350 MG/ML SOLN
75.0000 mL | Freq: Once | INTRAVENOUS | Status: AC | PRN
  Administered 2022-04-21: 75 mL via INTRAVENOUS

## 2022-04-21 MED ORDER — ALBUTEROL SULFATE HFA 108 (90 BASE) MCG/ACT IN AERS: 2.0000 | INHALATION_SPRAY | RESPIRATORY_TRACT | 0 refills | Status: AC | PRN

## 2022-04-21 MED ORDER — FLUTICASONE PROPIONATE HFA 44 MCG/ACT IN AERO: 2.0000 | INHALATION_SPRAY | Freq: Two times a day (BID) | RESPIRATORY_TRACT | 0 refills | Status: AC

## 2022-04-21 MED ORDER — SODIUM CHLORIDE 0.9 % IV BOLUS
1000.0000 mL | Freq: Once | INTRAVENOUS | Status: AC
  Administered 2022-04-21: 1000 mL via INTRAVENOUS

## 2022-04-21 NOTE — ED Provider Notes (Signed)
[facility] Provider Note    Event Date/Time   First MD Initiated Contact with Patient 04/21/22 [phone]     (approximate)   History   Shortness of Breath   HPI  History obtained via tele-Spanish interpreter  Paul Hayes is a 46 y.o. male who presents to the ED from home with a chief complaint of shortness of breath x3 days.  Patient states he was at a party and smoked a few cigarettes 3 days ago and since has been having trouble taking a deep breath.  When he does he has a little left-sided chest pain.  Denies fever, cough, abdominal pain, nausea, vomiting or dizziness.  Denies illicit drug use.  Denies recent travel, trauma or hormone use.     Past Medical History   Past Medical History:  Diagnosis Date  . Emphysema lung (HCC)      Active Problem List  There are no problems to display for this patient.    Past Surgical History   Past Surgical History:  Procedure Laterality Date  . ENDOSCOPIC TURBINATE REDUCTION Bilateral 05/07/2019   Procedure: ENDOSCOPIC TURBINATE REDUCTION;  Surgeon: Bud Face, MD;  Location: [facility];  Service: ENT;  Laterality: Bilateral;  . NO PAST SURGERIES    . SEPTOPLASTY N/A 05/07/2019   Procedure: SEPTOPLASTY;  Surgeon: Bud Face, MD;  Location: [facility];  Service: ENT;  Laterality: N/A;     Home Medications   Prior to Admission medications   Not on File     Allergies  Patient has no known allergies.   Family History  History reviewed. No pertinent family history.   Physical Exam  Triage Vital Signs: ED Triage Vitals  Enc Vitals Group     BP 04/21/22 0103 (!) 140/93     Pulse Rate 04/21/22 0103 69     Resp 04/21/22 0103 14     Temp 04/21/22 0103 98.1 F (36.7 C)     Temp Source 04/21/22 0103 Oral     SpO2 04/21/22 0103 99 %     Weight 04/21/22 0104 160 lb (72.6 kg)     Height 04/21/22 0104 5\' 4"  (1.626 m)     Head Circumference --      Peak Flow  --      Pain Score 04/21/22 0104 2     Pain Loc --      Pain Edu? --      Excl. in GC? --     Updated Vital Signs: BP 124/84   Pulse 74   Temp 98.2 F (36.8 C) (Oral)   Resp 20   Ht 5\' 4"  (1.626 m)   Wt 72.6 kg   SpO2 99%   BMI 27.46 kg/m    General: Awake, no distress.  CV:  RRR.  Good peripheral perfusion.  Resp:  Normal effort.  CTA B. Abd:  Nontender no distention.  Other:  Both calves are nonswollen and nontender.   ED Results / Procedures / Treatments  Labs (all labs ordered are listed, but only abnormal results are displayed) Labs Reviewed  CBC - Abnormal; Notable for the following components:      Result Value   WBC 10.9 (*)    All other components within normal limits  BASIC METABOLIC PANEL - Abnormal; Notable for the following components:   Glucose, Bld 104 (*)    All other components within normal limits  D-DIMER, QUANTITATIVE - Abnormal; Notable for the following components:   D-Dimer,  Quant 0.51 (*)    All other components within normal limits  TROPONIN I (HIGH SENSITIVITY)  TROPONIN I (HIGH SENSITIVITY)     EKG  ED ECG REPORT I, Chundra Sauerwein J, the attending physician, personally viewed and interpreted this ECG.   Date: 04/21/2022  EKG Time: 0059  Rate: 69  Rhythm: normal sinus rhythm  Axis: Normal  Intervals:none  ST&T Change: Nonspecific    RADIOLOGY I have independently visualized and interpreted patient's chest x-ray as well as noted the radiology interpretation:  Chest x-ray: No acute cardiopulmonary process  CTA chest: No PE; emphysema  Official radiology report(s): CT Angio Chest PE W/Cm &/Or Wo Cm  Result Date: 04/21/2022 CLINICAL DATA:  Positive D-dimer with pulmonary embolism suspected. EXAM: CT ANGIOGRAPHY CHEST WITH CONTRAST TECHNIQUE: Multidetector CT imaging of the chest was performed using the standard protocol during bolus administration of intravenous contrast. Multiplanar CT image reconstructions and MIPs were  obtained to evaluate the vascular anatomy. RADIATION DOSE REDUCTION: This exam was performed according to the departmental dose-optimization program which includes automated exposure control, adjustment of the mA and/or kV according to patient size and/or use of iterative reconstruction technique. CONTRAST:  61mL OMNIPAQUE IOHEXOL 350 MG/ML SOLN COMPARISON:  CT without contrast 09/21/2018. FINDINGS: Cardiovascular: The cardiac size is normal. There is no pericardial effusion. There is no visible coronary artery calcification. The aorta and great vessels are unremarkable. No atherosclerosis is seen no aneurysm or dissection. The pulmonary arteries are normal in caliber with no evidence of thromboemboli. The pulmonary veins are decompressed. Mediastinum/Nodes: No enlarged mediastinal, hilar, or axillary lymph nodes. Thyroid gland, trachea, and esophagus demonstrate no significant findings. Lungs/Pleura: The lungs show posterior atelectasis in the lower lobes and mild centrilobular and paraseptal emphysematous changes in the upper lobes. There is no new nodule or confluent infiltrate. There is no pleural effusion, thickening or pneumothorax. There is a stable 7 mm subfissural nodule in the anterior aspect of the superior segment of the right lower lobe on 5:83, stability over nearly 4 years time consistent with a benign entity. Upper Abdomen: No acute abnormality. Musculoskeletal: No chest wall abnormality. No acute or significant osseous findings. Review of the MIP images confirms the above findings. IMPRESSION: 1. No acute chest CT or CTA findings. 2. Emphysema. Electronically Signed   By: Telford Nab M.D.   On: 04/21/2022 05:27   DG Chest 2 View  Result Date: 04/21/2022 CLINICAL DATA:  Shortness of breath EXAM: CHEST - 2 VIEW COMPARISON:  10/12/2021 FINDINGS: Lungs are clear.  No pleural effusion or pneumothorax. The heart is normal in size. Visualized osseous structures are within normal limits. IMPRESSION:  Normal chest radiographs. Electronically Signed   By: Julian Hy M.D.   On: 04/21/2022 01:31     PROCEDURES:  Critical Care performed: No  .1-3 Lead EKG Interpretation  Performed by: Paulette Blanch, MD Authorized by: Paulette Blanch, MD     Interpretation: normal     ECG rate:  70   ECG rate assessment: normal     Rhythm: sinus rhythm     Ectopy: none     Conduction: normal   Comments:     Patient placed on cardiac monitor to evaluate for arrhythmias    MEDICATIONS ORDERED IN ED: Medications  famotidine (PEPCID) IVPB 20 mg premix (0 mg Intravenous Stopped 04/21/22 0358)  ipratropium-albuterol (DUONEB) 0.5-2.5 (3) MG/3ML nebulizer solution 3 mL (3 mLs Nebulization Given 04/21/22 0324)  sodium chloride 0.9 % bolus 1,000 mL (1,000 mLs Intravenous  New Bag/Given 04/21/22 0358)  iohexol (OMNIPAQUE) 350 MG/ML injection 75 mL (75 mLs Intravenous Contrast Given 04/21/22 0438)     IMPRESSION / MDM / ASSESSMENT AND PLAN / ED COURSE  I reviewed the triage vital signs and the nursing notes.                             46 year old male presenting with shortness of breath after smoking cigarettes. Differential includes, but is not limited to, viral syndrome, bronchitis including COPD exacerbation, pneumonia, reactive airway disease including asthma, CHF including exacerbation with or without pulmonary/interstitial edema, pneumothorax, ACS, thoracic trauma, and pulmonary embolism.  I have personally reviewed patient's records and sleeping mostly ED visits, last in February of this year for rectal pain.  Patient's presentation is most consistent with acute complicated illness / injury requiring diagnostic workup.  The patient is on the cardiac monitor to evaluate for evidence of arrhythmia and/or significant heart rate changes.  Laboratory results demonstrate normal WBC 10.9, normal electrolytes, initial troponin and chest x-ray unremarkable.  Will repeat time troponin, check D-dimer.   Administer DuoNeb for symptomatic relief.  Patient also mentions that he has bad reflux; will administer IV Pepcid.  Clinical Course as of 04/21/22 0540  Fri Apr 21, 2022  0345 D-dimer slightly elevated; will proceed with CTA chest to evaluate for PE. [JS]  0538 CTA negative for PE.  Patient feeling better after breathing treatment.  We will refill his Flovent and also add Albuterol inhaler to use as needed.  Strict return precautions given.  Patient verbalizes understanding agrees with plan of care. [JS]    Clinical Course User Index [JS] Irean Hong, MD     FINAL CLINICAL IMPRESSION(S) / ED DIAGNOSES   Final diagnoses:  Shortness of breath  Pulmonary emphysema, unspecified emphysema type (HCC)     Rx / DC Orders   ED Discharge Orders     None        Note:  This document was prepared using Dragon voice recognition software and may include unintentional dictation errors.   Irean Hong, MD 04/21/22 [phone]

## 2022-04-21 NOTE — ED Triage Notes (Signed)
Pt presents to ER with c/o sob x3 days.  Pt states he smoked a few cigarettes 3 days ago and since then has been having trouble taking a deep breath.  Pt states he is having a little chest pain at this time when he takes deep breath.  Pt denies cough.  Pt is A&O x4 at this time in NAD in triage.

## 2022-04-21 NOTE — Discharge Instructions (Signed)
1.  Use Flovent inhaler twice daily. 2.  You may use Albuterol inhaler 2 puffs every 4 hours as needed for breathing difficulty. 3.  Return to the ER for worsening symptoms, persistent vomiting, difficulty breathing or other concerns.

## 2022-11-18 ENCOUNTER — Emergency Department: Payer: BC Managed Care – PPO

## 2022-11-18 ENCOUNTER — Other Ambulatory Visit: Payer: Self-pay

## 2022-11-18 ENCOUNTER — Emergency Department
Admission: EM | Admit: 2022-11-18 | Discharge: 2022-11-18 | Disposition: A | Discharge status: Home / Self Care | Payer: BC Managed Care – PPO | Attending: Emergency Medicine | Admitting: Emergency Medicine

## 2022-11-18 DIAGNOSIS — R079 Chest pain, unspecified: Secondary | ICD-10-CM

## 2022-11-18 LAB — CBC
HCT: 45.6 % (ref 39.0–52.0)
Hemoglobin: 15.8 g/dL (ref 13.0–17.0)
MCH: 30.3 pg (ref 26.0–34.0)
MCHC: 34.6 g/dL (ref 30.0–36.0)
MCV: 87.5 fL (ref 80.0–100.0)
Platelets: 213 10*3/uL (ref 150–400)
RBC: 5.21 MIL/uL (ref 4.22–5.81)
RDW: 12.9 % (ref 11.5–15.5)
WBC: 9.5 10*3/uL (ref 4.0–10.5)
nRBC: 0 % (ref 0.0–0.2)

## 2022-11-18 LAB — BASIC METABOLIC PANEL
Anion gap: 7 (ref 5–15)
BUN: 15 mg/dL (ref 6–20)
CO2: 26 mmol/L (ref 22–32)
Calcium: 9.2 mg/dL (ref 8.9–10.3)
Chloride: 105 mmol/L (ref 98–111)
Creatinine, Ser: 0.88 mg/dL (ref 0.61–1.24)
GFR, Estimated: >60 mL/min (ref >60)
Glucose, Bld: 104 mg/dL — ABNORMAL HIGH (ref 70–99)
Potassium: 3.7 mmol/L (ref 3.5–5.1)
Sodium: 138 mmol/L (ref 135–145)

## 2022-11-18 LAB — HEPATIC FUNCTION PANEL
ALT: 34 U/L (ref 0–44)
AST: 28 U/L (ref 15–41)
Albumin: 4.4 g/dL (ref 3.5–5.0)
Alkaline Phosphatase: 79 U/L (ref 38–126)
Bilirubin, Direct: <0.1 mg/dL (ref 0.0–0.2)
Total Bilirubin: 0.5 mg/dL (ref 0.3–1.2)
Total Protein: 7.8 g/dL (ref 6.5–8.1)

## 2022-11-18 LAB — LIPASE, BLOOD: Lipase: 49 U/L (ref 11–51)

## 2022-11-18 LAB — TROPONIN I (HIGH SENSITIVITY)
Troponin I (High Sensitivity): 2 ng/L (ref <18)
Troponin I (High Sensitivity): <2 ng/L (ref <18)

## 2022-11-18 MED ORDER — NITROGLYCERIN 0.4 MG SL SUBL: 0.4000 mg | SUBLINGUAL_TABLET | SUBLINGUAL | Status: DC | PRN

## 2022-11-18 MED ORDER — ACETAMINOPHEN 500 MG PO TABS
1000.0000 mg | ORAL_TABLET | Freq: Once | ORAL | Status: AC
  Administered 2022-11-18: 1000 mg via ORAL
  Filled 2022-11-18: qty 2

## 2022-11-18 MED ORDER — KETOROLAC TROMETHAMINE 15 MG/ML IJ SOLN
15.0000 mg | Freq: Once | INTRAMUSCULAR | Status: AC
  Administered 2022-11-18: 15 mg via INTRAVENOUS
  Filled 2022-11-18: qty 1

## 2022-11-18 MED ORDER — IOHEXOL 350 MG/ML SOLN
75.0000 mL | Freq: Once | INTRAVENOUS | Status: AC | PRN
  Administered 2022-11-18: 75 mL via INTRAVENOUS

## 2022-11-18 NOTE — ED Provider Notes (Signed)
[facility] Provider Note    Event Date/Time   First MD Initiated Contact with Patient 11/18/22 1332     (approximate)   History   Chief Complaint: Chest Pain   HPI  Paul Hayes is a 47 y.o. male with a past history of emphysema who comes ED complaining of intermittent chest pain for the past week.  Feels like throbbing and sharp, radiates to left arm, associated with some intermittent shortness of breath.  Worse with deep breathing.  Also feels worse with exertion.  Associated with nausea and headache.  No syncope.  Currently pain is present but mild about 2/10 in intensity.  No lower extremity edema or calf tenderness or leg pain.  No recent travel trauma hospitalization or surgery.     Physical Exam   Triage Vital Signs: ED Triage Vitals  Enc Vitals Group     BP 11/18/22 1248 133/87     Pulse Rate 11/18/22 1248 83     Resp 11/18/22 1248 18     Temp 11/18/22 1248 97.6 F (36.4 C)     Temp Source 11/18/22 1248 Oral     SpO2 11/18/22 1248 97 %     Weight 11/18/22 1249 170 lb (77.1 kg)     Height --      Head Circumference --      Peak Flow --      Pain Score 11/18/22 1248 (S) 2     Pain Loc --      Pain Edu? --      Excl. in [place]? --     Most recent vital signs: Vitals:   11/18/22 1248  BP: 133/87  Pulse: 83  Resp: 18  Temp: 97.6 F (36.4 C)  SpO2: 97%    General: Awake, no distress.  CV:  Good peripheral perfusion.  Regular rate rhythm Resp:  Normal effort.  Clear to auscultation bilaterally Abd:  No distention.  Soft nontender Other:  No lower extremity edema or calf tenderness.  Symmetric calf circumference.   ED Results / Procedures / Treatments   Labs (all labs ordered are listed, but only abnormal results are displayed) Labs Reviewed  BASIC METABOLIC PANEL - Abnormal; Notable for the following components:      Result Value   Glucose, Bld 104 (*)    All other components within normal limits  CBC  HEPATIC  FUNCTION PANEL  LIPASE, BLOOD  TROPONIN I (HIGH SENSITIVITY)  TROPONIN I (HIGH SENSITIVITY)     EKG Interpreted by me Normal sinus rhythm rate of 82.  Normal axis intervals QRS ST segments and T waves.   RADIOLOGY Chest x-ray interpreted by me, appears normal.  Radiology report reviewed, noting 10 mm nodular density in the right upper lung   PROCEDURES:  Procedures   MEDICATIONS ORDERED IN ED: Medications  acetaminophen (TYLENOL) tablet 1,000 mg (has no administration in time range)  nitroGLYCERIN (NITROSTAT) SL tablet 0.4 mg (has no administration in time range)  ketorolac (TORADOL) 15 MG/ML injection 15 mg (has no administration in time range)     IMPRESSION / MDM / ASSESSMENT AND PLAN / ED COURSE  I reviewed the triage vital signs and the nursing notes.  DDx: Angina pectoris, non-STEMI, pulmonary embolism, GERD, bronchospasm  Patient's presentation is most consistent with acute presentation with potential threat to life or bodily function.  Patient presents with chest pain that is described as both exertional and as sharp and pleuritic.  Vital signs are normal, EKG  is unremarkable.  Chest x-ray shows a pulmonary nodule and radiology recommends follow-up CT scan.  Will obtain CT angiogram to rule out PE and further evaluate the nodule.  Initial troponin is normal.  Will trend.  I do not think his presentation is consistent with unstable angina.  He is very calm and nontoxic.  Will follow-up CT angiogram to evaluate for PE, at which point symptoms can be reassessed to determine if patient is appropriate for referral to cardiology versus hospitalization for further chest pain evaluation.       FINAL CLINICAL IMPRESSION(S) / ED DIAGNOSES   Final diagnoses:  Nonspecific chest pain     Rx / DC Orders   ED Discharge Orders     None        Note:  This document was prepared using Dragon voice recognition software and may include unintentional dictation errors.    Carrie Mew, MD 11/18/22 [phone]

## 2022-11-18 NOTE — ED Provider Notes (Signed)
Sleep signout on this patient pending the results of his serial troponin and CTA.  He is comfortable without ongoing chest pain now.  Vital signs have remained stable.  His troponins have been flat x 2 and CTA negative, previous pulmonary nodule is not identified on CT scan.  Discharged with cardiology follow-up.   Lucillie Garfinkel, MD 11/18/22 [phone]

## 2022-11-18 NOTE — ED Triage Notes (Signed)
Pt c/o chest pain for the past week. Feels like a throbbing in his chest and goes to his left arm. Pt has had nausea and headache as well as dizziness.

## 2022-11-18 NOTE — Discharge Instructions (Signed)
  Gracias por elegirnos para el cuidado de su salud hoy!  Consulte a su mdico de atencin primaria esta semana para una cita de seguimiento.  Si no tiene un mdico de atencin primaria llame a las siguientes clnicas para Psychologist, sport and exercise atencin:  Si tienes seguro: Fredia Sorrow [phone] [place] [address]   [place] [id number] [place] [id number]   Si no tienes seguro: Rosezetta Schlatter [address] [phone] [place] [address]  A veces, en las primeras etapas del curso de ciertas enfermedades es difcil detectarlo en la evaluacin del departamento de emergencias; por lo tanto, es importante que contine monitoreando sus sntomas y llame a su mdico de inmediato o regrese al departamento de emergencias si desarrolla alguno. sntomas nuevos o que empeoran.  Fue un placer cuidar de usted hoy.  Hoover Brunette Jacelyn Grip, MD

## 2023-05-19 LAB — EXTERNAL GENERIC LAB PROCEDURE: COLOGUARD: NEGATIVE

## 2023-09-23 ENCOUNTER — Other Ambulatory Visit: Payer: Self-pay

## 2023-09-23 ENCOUNTER — Emergency Department
Admission: EM | Admit: 2023-09-23 | Discharge: 2023-09-23 | Disposition: A | Discharge status: Home / Self Care | Payer: BC Managed Care – PPO | Attending: Emergency Medicine | Admitting: Emergency Medicine

## 2023-09-23 DIAGNOSIS — H5712 Ocular pain, left eye: Secondary | ICD-10-CM | POA: Diagnosis present

## 2023-09-23 MED ORDER — FLUORESCEIN SODIUM 1 MG OP STRP
1.0000 | ORAL_STRIP | Freq: Once | OPHTHALMIC | Status: AC
  Administered 2023-09-23: 1 via OPHTHALMIC
  Filled 2023-09-23: qty 1

## 2023-09-23 MED ORDER — OFLOXACIN 0.3 % OP SOLN: 1.0000 [drp] | OPHTHALMIC | 0 refills | Status: AC

## 2023-09-23 MED ORDER — TETRACAINE HCL 0.5 % OP SOLN
1.0000 [drp] | Freq: Once | OPHTHALMIC | Status: AC
  Administered 2023-09-23: 1 [drp] via OPHTHALMIC
  Filled 2023-09-23: qty 4

## 2023-09-23 NOTE — Discharge Instructions (Addendum)
Call and schedule an appointment with Dr. Druscilla Brownie if symptoms do no improve for further evaluation.   Take tylenol and ibuprofen for pain.

## 2023-09-23 NOTE — ED Triage Notes (Signed)
Redness to left lateral eye x 3 days. Denies injury, denies change of vision.  Using clear eye eye drops, with no change.  Denies drainage or pain to eye.

## 2023-09-23 NOTE — ED Provider Notes (Signed)
[facility] Emergency Department Provider Note     Event Date/Time   First MD Initiated Contact with Patient 09/23/23 1049     (approximate)   History   Eye Problem   HPI  Paul Hayes is a 47 y.o. male resents to the ED with complaint of left eye redness and irritation x 3 days.  Denies visual changes and injury.  Reports he woke up in his eye was red.  He has tried clear eye eyedrops with no relief.  Patient reports it feels like something may be in it.  Denies fever and sick contacts.  Denies other symptoms.    Physical Exam   Triage Vital Signs: ED Triage Vitals  Encounter Vitals Group     BP 09/23/23 1003 114/81     Systolic BP Percentile --      Diastolic BP Percentile --      Pulse Rate 09/23/23 1003 94     Resp 09/23/23 1003 16     Temp 09/23/23 1003 98.3 F (36.8 C)     Temp Source 09/23/23 1003 Oral     SpO2 09/23/23 1003 96 %     Weight 09/23/23 1002 169 lb 15.6 oz (77.1 kg)     Height --      Head Circumference --      Peak Flow --      Pain Score 09/23/23 1002 0     Pain Loc --      Pain Education --      Exclude from Growth Chart --     Most recent vital signs: Vitals:   09/23/23 1003  BP: 114/81  Pulse: 94  Resp: 16  Temp: 98.3 F (36.8 C)  SpO2: 96%    General Awake, no distress.  HEENT NCAT. PERRL. EOMI. No rhinorrhea. Mucous membranes are moist.  CV:  Good peripheral perfusion.  RESP:  Normal effort.  ABD:  No distention.  Other:  Left eye reveals redness of the subconjunctival without involvement of lens.  No pain with EOM.  No discharge or optic swelling.  Eye exam performed with fluorescein no foreign body noted.  Is a foreseen uptake of the left lateral inferior area of cornea.   ED Results / Procedures / Treatments   Labs (all labs ordered are listed, but only abnormal results are displayed) Labs Reviewed - No data to display  No results found.  PROCEDURES:  Critical Care performed:  No  Procedures   MEDICATIONS ORDERED IN ED: Medications  fluorescein ophthalmic strip 1 strip (1 strip Left Eye Given 09/23/23 1127)  tetracaine (PONTOCAINE) 0.5 % ophthalmic solution 1-2 drop (1 drop Right Eye Given by Other 09/23/23 1126)     IMPRESSION / MDM / ASSESSMENT AND PLAN / ED COURSE  I reviewed the triage vital signs and the nursing notes.                               47 y.o. male presents to the emergency department for evaluation and treatment of left eye redness. See HPI for further details.   Differential diagnosis includes, but is not limited to corneal abrasion, subconjunctival hemorrhage, foreign body  Patient's presentation is most consistent with acute complicated illness / injury requiring diagnostic workup.  Patient is alert and oriented.  He is hemodynamically stable and afebrile.  Physical exam findings are as above.  Presentation is clinically consistent with subconjunctival hemorrhage,  however there is a fluorescein uptake of a large proportion of the lower left lateral area of the orbit.  I will give antibiotic drops.  Patient reports no pain which is creasing suspicions for subconjunctival hemorrhage.  Patient educated on symptoms resolving over few days.  Advised to follow-up with ophthalmology if symptoms persist.  Patient verbalized understanding.  ED precautions discussed.   FINAL CLINICAL IMPRESSION(S) / ED DIAGNOSES   Final diagnoses:  Left eye pain   Rx / DC Orders   ED Discharge Orders          Ordered    ofloxacin (OCUFLOX) 0.3 % ophthalmic solution  Every 4 hours        09/23/23 1123             Note:  This document was prepared using Dragon voice recognition software and may include unintentional dictation errors.    Kern Reap A, PA-C 09/23/23 1351    Concha Se, MD 09/23/23 1451
